# Patient Record
Sex: Male | Born: 1940
Health system: Southern US, Community
[De-identification: ages and names within clinical notes are randomized; demographics above are authoritative.]

## PROBLEM LIST (undated history)

## (undated) DIAGNOSIS — H353110 Nonexudative age-related macular degeneration, right eye, stage unspecified: Secondary | ICD-10-CM

## (undated) DIAGNOSIS — E7800 Pure hypercholesterolemia, unspecified: Secondary | ICD-10-CM

## (undated) DIAGNOSIS — I10 Essential (primary) hypertension: Secondary | ICD-10-CM

## (undated) DIAGNOSIS — I4892 Unspecified atrial flutter: Secondary | ICD-10-CM

## (undated) DIAGNOSIS — M545 Low back pain, unspecified: Secondary | ICD-10-CM

## (undated) DIAGNOSIS — J301 Allergic rhinitis due to pollen: Secondary | ICD-10-CM

## (undated) DIAGNOSIS — E0590 Thyrotoxicosis, unspecified without thyrotoxic crisis or storm: Secondary | ICD-10-CM

## (undated) DIAGNOSIS — J189 Pneumonia, unspecified organism: Secondary | ICD-10-CM

## (undated) DIAGNOSIS — G8929 Other chronic pain: Secondary | ICD-10-CM

## (undated) DIAGNOSIS — I480 Paroxysmal atrial fibrillation: Secondary | ICD-10-CM

## (undated) DIAGNOSIS — I2510 Atherosclerotic heart disease of native coronary artery without angina pectoris: Secondary | ICD-10-CM

## (undated) HISTORY — PX: ATRIAL ABLATION SURGERY: SHX560

## (undated) HISTORY — DX: Unspecified atrial flutter: I48.92

## (undated) HISTORY — PX: COLONOSCOPY: SHX174

## (undated) HISTORY — DX: Thyrotoxicosis, unspecified without thyrotoxic crisis or storm: E05.90

## (undated) HISTORY — DX: Paroxysmal atrial fibrillation: I48.0

## (undated) HISTORY — PX: COLONOSCOPY W/ BIOPSIES AND POLYPECTOMY: SHX1376

## (undated) HISTORY — DX: Essential (primary) hypertension: I10

---

## 1990-02-11 DIAGNOSIS — J189 Pneumonia, unspecified organism: Secondary | ICD-10-CM

## 1990-02-11 HISTORY — DX: Pneumonia, unspecified organism: J18.9

## 1999-02-12 HISTORY — PX: CARDIOVERSION: SHX1299

## 1999-11-29 ENCOUNTER — Encounter: Payer: Self-pay | Admitting: Internal Medicine

## 1999-12-04 ENCOUNTER — Inpatient Hospital Stay (HOSPITAL_COMMUNITY): Admission: EM | Admit: 1999-12-04 | Discharge: 1999-12-07 | Payer: Self-pay | Admitting: Emergency Medicine

## 2001-01-01 ENCOUNTER — Emergency Department (HOSPITAL_COMMUNITY): Admission: EM | Admit: 2001-01-01 | Discharge: 2001-01-02 | Payer: Self-pay | Admitting: *Deleted

## 2001-06-22 ENCOUNTER — Encounter: Payer: Self-pay | Admitting: Cardiology

## 2001-06-22 ENCOUNTER — Inpatient Hospital Stay (HOSPITAL_COMMUNITY): Admission: AD | Admit: 2001-06-22 | Discharge: 2001-06-24 | Payer: Self-pay | Admitting: Cardiology

## 2007-06-22 ENCOUNTER — Encounter: Payer: Self-pay | Admitting: Internal Medicine

## 2009-06-28 ENCOUNTER — Encounter: Payer: Self-pay | Admitting: Internal Medicine

## 2009-07-03 ENCOUNTER — Encounter: Payer: Self-pay | Admitting: Internal Medicine

## 2009-07-03 LAB — TSH: TSH: 1.79 u[IU]/mL (ref <=5.90)

## 2009-07-03 LAB — BASIC METABOLIC PANEL: Potassium: 5.5 mmol/L — AB (ref 3.4–5.3)

## 2009-07-04 ENCOUNTER — Encounter: Payer: Self-pay | Admitting: Internal Medicine

## 2009-08-02 ENCOUNTER — Ambulatory Visit: Payer: Self-pay | Admitting: Internal Medicine

## 2009-08-02 DIAGNOSIS — I4892 Unspecified atrial flutter: Secondary | ICD-10-CM

## 2009-08-02 DIAGNOSIS — I4891 Unspecified atrial fibrillation: Secondary | ICD-10-CM

## 2009-08-09 ENCOUNTER — Encounter: Payer: Self-pay | Admitting: Internal Medicine

## 2009-08-17 ENCOUNTER — Encounter: Payer: Self-pay | Admitting: Internal Medicine

## 2009-08-22 ENCOUNTER — Encounter: Payer: Self-pay | Admitting: Cardiology

## 2009-09-12 ENCOUNTER — Ambulatory Visit: Payer: Self-pay | Admitting: Internal Medicine

## 2009-09-13 LAB — CONVERTED CEMR LAB
BUN: 13 mg/dL (ref 6–23)
Basophils Absolute: 0 10*3/uL (ref 0.0–0.1)
Basophils Relative: 0.4 % (ref 0.0–3.0)
CO2: 29 meq/L (ref 19–32)
Calcium: 8.9 mg/dL (ref 8.4–10.5)
Chloride: 111 meq/L (ref 96–112)
Creatinine, Ser: 0.9 mg/dL (ref 0.4–1.5)
Eosinophils Absolute: 0.1 10*3/uL (ref 0.0–0.7)
Eosinophils Relative: 1.5 % (ref 0.0–5.0)
GFR calc non Af Amer: 88.97 mL/min (ref >60)
Glucose, Bld: 88 mg/dL (ref 70–99)
HCT: 39.3 % (ref 39.0–52.0)
Hemoglobin: 13.5 g/dL (ref 13.0–17.0)
INR: 2.9 — ABNORMAL HIGH (ref 0.8–1.0)
Lymphocytes Relative: 20.2 % (ref 12.0–46.0)
Lymphs Abs: 1.5 10*3/uL (ref 0.7–4.0)
MCHC: 34.4 g/dL (ref 30.0–36.0)
MCV: 94.3 fL (ref 78.0–100.0)
Monocytes Absolute: 0.7 10*3/uL (ref 0.1–1.0)
Monocytes Relative: 9.7 % (ref 3.0–12.0)
Neutro Abs: 4.9 10*3/uL (ref 1.4–7.7)
Neutrophils Relative %: 68.2 % (ref 43.0–77.0)
Platelets: 205 10*3/uL (ref 150.0–400.0)
Potassium: 4.3 meq/L (ref 3.5–5.1)
Prothrombin Time: 31.1 s — ABNORMAL HIGH (ref 9.7–11.8)
RBC: 4.17 M/uL — ABNORMAL LOW (ref 4.22–5.81)
RDW: 15.6 % — ABNORMAL HIGH (ref 11.5–14.6)
Sodium: 142 meq/L (ref 135–145)
WBC: 7.2 10*3/uL (ref 4.5–10.5)
aPTT: 43.7 s — ABNORMAL HIGH (ref 21.7–28.8)

## 2009-09-18 ENCOUNTER — Ambulatory Visit: Payer: Self-pay | Admitting: Cardiovascular Disease

## 2009-09-21 ENCOUNTER — Telehealth: Payer: Self-pay | Admitting: Internal Medicine

## 2009-10-10 ENCOUNTER — Ambulatory Visit: Payer: Self-pay | Admitting: Internal Medicine

## 2009-10-10 ENCOUNTER — Telehealth: Payer: Self-pay | Admitting: Internal Medicine

## 2009-10-10 LAB — CONVERTED CEMR LAB
BUN: 17 mg/dL (ref 6–23)
Basophils Absolute: 0 10*3/uL (ref 0.0–0.1)
Basophils Relative: 0.4 % (ref 0.0–3.0)
CO2: 27 meq/L (ref 19–32)
Calcium: 8.8 mg/dL (ref 8.4–10.5)
Chloride: 102 meq/L (ref 96–112)
Creatinine, Ser: 0.9 mg/dL (ref 0.4–1.5)
Eosinophils Absolute: 0.2 10*3/uL (ref 0.0–0.7)
Eosinophils Relative: 2 % (ref 0.0–5.0)
GFR calc non Af Amer: 90.11 mL/min (ref >60)
Glucose, Bld: 95 mg/dL (ref 70–99)
HCT: 37.9 % — ABNORMAL LOW (ref 39.0–52.0)
Hemoglobin: 12.9 g/dL — ABNORMAL LOW (ref 13.0–17.0)
INR: 2.6 — ABNORMAL HIGH (ref 0.8–1.0)
Lymphocytes Relative: 22.3 % (ref 12.0–46.0)
Lymphs Abs: 1.7 10*3/uL (ref 0.7–4.0)
MCHC: 34.1 g/dL (ref 30.0–36.0)
MCV: 94.7 fL (ref 78.0–100.0)
Monocytes Absolute: 0.7 10*3/uL (ref 0.1–1.0)
Monocytes Relative: 9.8 % (ref 3.0–12.0)
Neutro Abs: 4.9 10*3/uL (ref 1.4–7.7)
Neutrophils Relative %: 65.5 % (ref 43.0–77.0)
Platelets: 200 10*3/uL (ref 150.0–400.0)
Potassium: 4.4 meq/L (ref 3.5–5.1)
Prothrombin Time: 28.1 s — ABNORMAL HIGH (ref 9.7–11.8)
RBC: 4 M/uL — ABNORMAL LOW (ref 4.22–5.81)
RDW: 15.3 % — ABNORMAL HIGH (ref 11.5–14.6)
Sodium: 136 meq/L (ref 135–145)
WBC: 7.6 10*3/uL (ref 4.5–10.5)
aPTT: 41.1 s — ABNORMAL HIGH (ref 21.7–28.8)

## 2009-10-12 ENCOUNTER — Encounter: Payer: Self-pay | Admitting: Internal Medicine

## 2009-10-12 HISTORY — PX: TRANSESOPHAGEAL ECHOCARDIOGRAM: SHX273

## 2009-10-19 ENCOUNTER — Encounter: Payer: Self-pay | Admitting: Cardiology

## 2009-10-19 ENCOUNTER — Ambulatory Visit: Payer: Self-pay | Admitting: Internal Medicine

## 2009-10-19 ENCOUNTER — Observation Stay (HOSPITAL_COMMUNITY): Admission: RE | Admit: 2009-10-19 | Discharge: 2009-10-20 | Payer: Self-pay | Admitting: Internal Medicine

## 2010-01-17 ENCOUNTER — Ambulatory Visit: Payer: Self-pay | Admitting: Internal Medicine

## 2010-01-17 ENCOUNTER — Encounter: Payer: Self-pay | Admitting: Internal Medicine

## 2010-02-22 ENCOUNTER — Encounter: Payer: Self-pay | Admitting: Cardiology

## 2010-02-23 ENCOUNTER — Encounter: Payer: Self-pay | Admitting: Cardiology

## 2010-02-23 DIAGNOSIS — Z7901 Long term (current) use of anticoagulants: Secondary | ICD-10-CM | POA: Insufficient documentation

## 2010-02-23 DIAGNOSIS — E0590 Thyrotoxicosis, unspecified without thyrotoxic crisis or storm: Secondary | ICD-10-CM | POA: Insufficient documentation

## 2010-02-23 DIAGNOSIS — I480 Paroxysmal atrial fibrillation: Secondary | ICD-10-CM | POA: Insufficient documentation

## 2010-02-23 DIAGNOSIS — H3530 Unspecified macular degeneration: Secondary | ICD-10-CM | POA: Insufficient documentation

## 2010-02-23 DIAGNOSIS — Z5181 Encounter for therapeutic drug level monitoring: Secondary | ICD-10-CM | POA: Insufficient documentation

## 2010-03-13 NOTE — Progress Notes (Signed)
Summary: Med List  Med List   Imported By: Roderic Ovens 09/26/2009 14:53:44  _____________________________________________________________________  External Attachment:    Type:   Image     Comment:   External Document

## 2010-03-13 NOTE — Assessment & Plan Note (Signed)
Summary: John Sheppard   Visit Type:  Initial Consult Referring Provider:  Peter Swaziland, MD Primary Provider:  Jarold Motto  CC:  afib.  History of Present Illness: John Sheppard is a pleasant 70 yo WM with a h/o paroxysmal atrial fibrillation and atrial flutter who presents today for EP consultation regarding his atrial arrhythmias.  He reports initially being diagnosed with afib in 2001 and being found to have RVR while donating blood.  He was evaluated by Dr Swaziland and initiated on Toprol for rate control.  He was found to have a tachycardia mediated cardiomyopathy with an EF of 30%.  He was iniated on coumadin and then cardioverted to sinus rhythm.  He reports maintaining sinus rhythm for a year before returning to afib.  He reports increasing frequency and duration of atrial fibrillation since that time.  He reports having episodes during his sleep and also in the morning.  He was placed on amiodarone for a short period of time but did not tolerate this medicine well.  He was then placed on sotalol.  He does not feel that his afib has improved with sotalol.  Presently, he reports having episodes of afib every 1-2 weeks.  He reports that episodes typically last several hours.  He reports symptoms of palpitations.  He also reports post termination pauses with dizziness lasting only several seconds.  He reports having brief LOC several weeks ago during conversion to sinus rhythm.  Current Medications (verified): 1)  Betapace 160 Mg Tabs (Sotalol Hcl) .Marland Kitchen.. 1 By Mouth Two Times A Day 2)  Warfarin Sodium 4 Mg Tabs (Warfarin Sodium) .... As Directed  Allergies (verified): No Known Drug Allergies  Past History:  Past Medical History: Paroxysmal atrial fibrillation Typical appearing atrial flutter Prior tachycardia mediated cardiomypathy with EF 30% now resolved Macular degeneration hyperthyroidism with amiodarone s/p therapy with PTU  Past Surgical History: none  Family  History: father died at age 68 of CHF  Social History: Lives in [place] with spouse.  Retired from [employer].  Denies tobacco,  occasional ETOH.  Review of Systems       All systems are reviewed and negative except as listed in the HPI.   Vital Signs:  Patient profile:   70 year old male Height:      72 inches Weight:      184 pounds BMI:     25.05 Pulse rate:   57 / minute Resp:     18 per minute BP sitting:   122 / 81  (right arm)  Vitals Entered By: Marrion Coy, CNA (August 02, 2009 2:48 PM)  Physical Exam  General:  Well developed, well nourished, in no acute distress. Head:  normocephalic and atraumatic Eyes:  PERRLA/EOM intact; conjunctiva and lids normal. Nose:  no deformity, discharge, inflammation, or lesions Mouth:  Teeth, gums and palate normal. Oral mucosa normal. Neck:  Neck supple, no JVD. No masses, thyromegaly or abnormal cervical nodes. Lungs:  Clear bilaterally to auscultation and percussion. Heart:  Non-displaced PMI, chest non-tender; regular rate and rhythm, S1, S2 without murmurs, rubs or gallops. Carotid upstroke normal, no bruit. Normal abdominal aortic size, no bruits. Femorals normal pulses, no bruits. Pedals normal pulses. No edema, no varicosities. Abdomen:  Bowel sounds positive; abdomen soft and non-tender without masses, organomegaly, or hernias noted. No hepatosplenomegaly. Msk:  Back normal, normal gait. Muscle strength and tone normal. Pulses:  pulses normal in all 4 extremities Extremities:  No clubbing or cyanosis. Neurologic:  Alert and oriented x  3. Skin:  Intact without lesions or rashes. Psych:  Normal affect.   EKG  Procedure date:  08/02/2009  Findings:      sinus bradycardia 57 bpm, PR 162, QTc 400, otherwise normal ekg  Impression & Recommendations:  Problem # 1:  ATRIAL FIBRILLATION (ICD-427.31) The patient has symptomatic paroxysmal atrial fibrillation and typical appearing atrial flutter.  He has failed medical therapy  with toprol, amiodarone, and sotalol.  He is chronically anticoagulated with coumadin.  Therapeutic strategies for afib and atrial flutter including medicine and Sheppard were discussed in detail with the patient today. Risk, benefits, and alternatives to EP study and radiofrequency Sheppard  were also discussed in detail today. These risks include but are not limited to stroke, bleeding, vascular damage, tamponade, perforation, damage to the esophagus, lungs, and other structures, pulmonary vein stenosis, worsening renal function, and death. The patient understands these risk and wishes to proceed. We will schedule Sheppard at the next available time.  Appended Document: John Sheppard Echo reviewed from 3/11 (Dr Swaziland)  reveals normal LV size and function.  Normal LA size, without significant valvular abnormality.

## 2010-03-13 NOTE — Letter (Signed)
Summary: ELectrophysiology/Ablation Procedure Instructions  [facility], Main Office  [address]   [place], [place] [address]   Phone: [phone]  Fax: [phone]     Electrophysiology/Ablation Procedure Instructions    You are scheduled for a(n) a-fib ablation on 09/05/2009 at 7:30am with Dr. Johney Frame.  1.  Please come to the Short Stay Center at [facility] at 5:30am on the day of your procedure.  2.  Come prepared to stay overnight.   Please bring your insurance cards and a list of your medications.  3.  Come to the [place] office on 08/29/09 at 10:30am for lab work. .  You do not have to be fasting.  4.  Do not have anything to eat or drink after midnight the night before your procedure.  5.   All of your remaining medications may be taken with a small amount of water.  6.  Educational material received:    _____ Ablation   * Occasionally, EP studies and ablations can become lengthy.  Please make your family aware of this before your procedure starts.  Average time ranges from 2-8 hours for EP studies/ablations.  Your physician will locate your family after the procedure with the results.  * If you have any questions after you get home, please call the office at [phone].  Anselm Pancoast  TEE--with Dr Swaziland they will call with time to be at the hospital on 09/04/09  Appended Document: ELectrophysiology/Ablation Procedure Instructions will change pt's procedure date to 09/19/2009  TEE--09/18/09 with Dr Swaziland  Melinda aware and will arange  Labs will be done on 09/12/09 at 10:15.

## 2010-03-13 NOTE — Letter (Signed)
Summary: [facility] Office Visit Note   [facility] Office Visit Note   Imported By: Roderic Ovens 09/26/2009 15:17:02  _____________________________________________________________________  External Attachment:    Type:   Image     Comment:   External Document

## 2010-03-13 NOTE — Assessment & Plan Note (Signed)
Summary: John Sheppard   Visit Type:  EPH Referring John Sheppard:  Peter Swaziland, MD Primary John Sheppard:  John Sheppard   History of Present Illness: The patient presents today for routine electrophysiology followup. He reports doing very well since his afib ablation.  He denies complications related to the procedure.  He has had occaisonal ERAF but feels that his afib has significantly improved since his ablation.   The patient denies symptoms of chest pain, shortness of breath, orthopnea, PND, lower extremity edema, presyncope, syncope, or neurologic sequela.  He has had one episode of dizziness upon conversion of afib to sinus but denies presyncope or syncope. The patient is tolerating medications without difficulties and is otherwise without complaint today.   Current Medications (verified): 1)  Betapace 160 Mg Tabs (Sotalol Hcl) .Marland Kitchen.. 1 By Mouth Two Times A Day 2)  Warfarin Sodium 4 Mg Tabs (Warfarin Sodium) .... As Directed  Allergies (verified): No Known Drug Allergies  Past History:  Past Medical History: Paroxysmal atrial fibrillation s/p PVI 9/11 Typical appearing atrial flutter Prior tachycardia mediated cardiomypathy with EF 30% now resolved Macular degeneration hyperthyroidism with amiodarone s/p therapy with PTU  Past Surgical History: s/p PVI 9/11  Social History: Reviewed history from 08/02/2009 and no changes required. Lives in [place] with spouse.  Retired from [employer].  Denies tobacco,  occasional ETOH.  Vital Signs:  Patient profile:   70 year old male Height:      72 inches Weight:      185 pounds BMI:     25.18 Pulse rate:   57 / minute Pulse rhythm:   irregular BP sitting:   134 / 82  (right arm) Cuff size:   large  Vitals Entered By: Danielle Rankin, CMA (January 17, 2010 10:25 AM)  Physical Exam  General:  Well developed, well nourished, in no acute distress. Head:  normocephalic and atraumatic Eyes:  PERRLA/EOM intact; conjunctiva and lids normal. Mouth:   Teeth, gums and palate normal. Oral mucosa normal. Neck:  Neck supple, no JVD. No masses, thyromegaly or abnormal cervical nodes. Lungs:  Clear bilaterally to auscultation and percussion. Heart:  Non-displaced PMI, chest non-tender; regular rate and rhythm, S1, S2 without murmurs, rubs or gallops. Carotid upstroke normal, no bruit. Normal abdominal aortic size, no bruits. Femorals normal pulses, no bruits. Pedals normal pulses. No edema, no varicosities. Abdomen:  Bowel sounds positive; abdomen soft and non-tender without masses, organomegaly, or hernias noted. No hepatosplenomegaly. Msk:  Back normal, normal gait. Muscle strength and tone normal. Extremities:  No clubbing or cyanosis. Neurologic:  Alert and oriented x 3. Skin:  Intact without lesions or rashes. Psych:  Normal affect.   EKG  Procedure date:  01/17/2010  Findings:      sinus bradycardia 57 bpm, PR 164, Qtc 412, otherwise normal ekg  Impression & Recommendations:  Problem # 1:  ATRIAL FIBRILLATION (ICD-427.31) doing well s/p ablation with occasional ERAF.  We will continue to monitor for return of sypmtomatic afib. Continue current medicine return in 3 months  Patient Instructions: 1)  Your physician recommends that you schedule a follow-up appointment in: 3 months. 2)  Your physician recommends that you continue on your current medications as directed. Please refer to the Current Medication list given to you today.

## 2010-03-13 NOTE — Letter (Signed)
Summary: [facility] EKG   [facility] EKG   Imported By: Roderic Ovens 09/26/2009 15:22:45  _____________________________________________________________________  External Attachment:    Type:   Image     Comment:   External Document

## 2010-03-13 NOTE — Progress Notes (Signed)
Summary: ablation -scheduled Tresa Endo to call 8/15)  Phone Note Call from Patient Call back at Home Phone [phone]   Caller: Spouse- carolyn  Reason for Call: Talk to Nurse Summary of Call: checking the status of ablation to be scheduled Initial call taken by: Lorne Skeens,  September 21, 2009 11:29 AM  Follow-up for Phone Call        I called and spoke with Mrs. Eppinger. I made her aware that I will have Tresa Endo, Dr. Jenel Lucks nurse, call them back on Monday 09/25/09. She is agreeable.  per pt calling back [phone] Lorne Skeens  September 25, 2009 9:00 AM  Follow-up by: Sherri Rad, RN, BSN,  September 21, 2009 11:48 AM  Additional Follow-up for Phone Call Additional follow up Details #1::        ablation 10/17/09 labs 10/10/09 at 10:30am Dennis Bast, RN, BSN  September 25, 2009 6:27 PM

## 2010-03-13 NOTE — Progress Notes (Signed)
Summary: discuss procedure on 9/6  Phone Note Call from Patient Call back at Home Phone [phone]   Caller: Patient Reason for Call: Talk to Nurse Summary of Call: Need to discuss Tee/Cardioversion on 9/6.  Want to make sure everythings is in order since this is a resched. procedure.  Initial call taken by: Omar Person,  October 10, 2009 1:35 PM  Follow-up for Phone Call        left mess for pt would have Tresa Endo call her tom 8/31, looks like pt needs a-fib ablation not a dccv, but nothing is sch Meredith Staggers, RN  October 10, 2009 1:52 PM  spoke with wife will do ablation on 10/19/09 at 7:30am  will call tomorrow and let me know when he can come in for labs Dennis Bast, RN, BSN  October 11, 2009 6:19 PM pt got labs on 10/10/09 will not need labs  Spoke with Aurelio Jew  he will arraange TEE in EP lab sameday as ablation Dennis Bast, RN, BSN  October 12, 2009 9:35 AM

## 2010-03-15 ENCOUNTER — Ambulatory Visit (INDEPENDENT_AMBULATORY_CARE_PROVIDER_SITE_OTHER): Payer: Medicare HMO | Admitting: Cardiology

## 2010-03-15 ENCOUNTER — Ambulatory Visit: Payer: Self-pay | Admitting: Cardiology

## 2010-03-15 DIAGNOSIS — I4891 Unspecified atrial fibrillation: Secondary | ICD-10-CM

## 2010-03-15 DIAGNOSIS — Z7901 Long term (current) use of anticoagulants: Secondary | ICD-10-CM

## 2010-04-26 ENCOUNTER — Encounter: Payer: Self-pay | Admitting: Internal Medicine

## 2010-04-26 ENCOUNTER — Ambulatory Visit (INDEPENDENT_AMBULATORY_CARE_PROVIDER_SITE_OTHER): Payer: Medicare HMO | Admitting: Internal Medicine

## 2010-04-26 DIAGNOSIS — I4891 Unspecified atrial fibrillation: Secondary | ICD-10-CM

## 2010-04-26 LAB — BASIC METABOLIC PANEL
BUN: 12 mg/dL (ref 6–23)
CO2: 27 mEq/L (ref 19–32)
Chloride: 106 mEq/L (ref 96–112)
Creatinine, Ser: 0.87 mg/dL (ref 0.4–1.5)
Glucose, Bld: 104 mg/dL — ABNORMAL HIGH (ref 70–99)
Potassium: 4.1 mEq/L (ref 3.5–5.1)

## 2010-04-26 LAB — CBC
HCT: 41.7 % (ref 39.0–52.0)
MCH: 31.3 pg (ref 26.0–34.0)
MCV: 92.5 fL (ref 78.0–100.0)
Platelets: 205 10*3/uL (ref 150–400)
RDW: 13.9 % (ref 11.5–15.5)
WBC: 7.3 10*3/uL (ref 4.0–10.5)

## 2010-04-26 LAB — MRSA PCR SCREENING: MRSA by PCR: NEGATIVE

## 2010-05-01 NOTE — Assessment & Plan Note (Signed)
Summary: per ck out on 12/7/lg/kl   Visit Type:  Follow-up Referring Provider:  Peter Swaziland, MD Primary Provider:  Jarold Motto   History of Present Illness: The patient presents today for routine electrophysiology followup. He reports doing very well since his afib ablation.  His afib burden has dramatically improved.  He does recall having two episodes since last being seen in our office where he developed sustained palpitations and suspects that he was in afib.  These episodes lasted several hours, waking him from sleep.  Prior to ablation, he reports having daily afib and feels that this has resolved.   The patient denies symptoms of chest pain, shortness of breath, orthopnea, PND, lower extremity edema, presyncope, syncope, or neurologic sequela.  He has had prior dizziness upon conversion of afib to sinus but denies presyncope or syncope. The patient is tolerating medications without difficulties and is otherwise without complaint today.   Current Medications (verified): 1)  Betapace 160 Mg Tabs (Sotalol Hcl) .Marland Kitchen.. 1 By Mouth Two Times A Day 2)  Warfarin Sodium 4 Mg Tabs (Warfarin Sodium) .... As Directed  Allergies (verified): No Known Drug Allergies  Past History:  Past Medical History: Reviewed history from 01/17/2010 and no changes required. Paroxysmal atrial fibrillation s/p PVI 9/11 Typical appearing atrial flutter Prior tachycardia mediated cardiomypathy with EF 30% now resolved Macular degeneration hyperthyroidism with amiodarone s/p therapy with PTU  Past Surgical History: Reviewed history from 01/17/2010 and no changes required. s/p PVI 9/11  Social History: Reviewed history from 08/02/2009 and no changes required. Lives in [place] with spouse.  Retired from [employer].  Denies tobacco,  occasional ETOH.  Review of Systems       All systems are reviewed and negative except as listed in the HPI.   Vital Signs:  Patient profile:   70 year old male Height:       72 inches Weight:      187 pounds BMI:     25.45 Pulse rate:   53 / minute BP sitting:   120 / 70  (left arm)  Vitals Entered By: Laurance Flatten CMA (April 26, 2010 11:13 AM)  Physical Exam  General:  Well developed, well nourished, in no acute distress. Head:  normocephalic and atraumatic Eyes:  PERRLA/EOM intact; conjunctiva and lids normal. Mouth:  Teeth, gums and palate normal. Oral mucosa normal. Neck:  Neck supple, no JVD. No masses, thyromegaly or abnormal cervical nodes. Lungs:  Clear bilaterally to auscultation and percussion. Heart:  Non-displaced PMI, chest non-tender; regular rate and rhythm, S1, S2 without murmurs, rubs or gallops. Carotid upstroke normal, no bruit. Normal abdominal aortic size, no bruits. Femorals normal pulses, no bruits. Pedals normal pulses. No edema, no varicosities. Abdomen:  Bowel sounds positive; abdomen soft and non-tender without masses, organomegaly, or hernias noted. No hepatosplenomegaly. Msk:  Back normal, normal gait. Muscle strength and tone normal. Extremities:  No clubbing or cyanosis. Neurologic:  Alert and oriented x 3. Skin:  Intact without lesions or rashes. Psych:  Normal affect.   EKG  Procedure date:  04/26/2010  Findings:      sinus bradycardia 53 bpm, Qtc 418, othrewise normal ekg  Impression & Recommendations:  Problem # 1:  ATRIAL FIBRILLATION (ICD-427.31) doing very well s/p ablation he may be still having rare afib but feels overall much improved.  He does not feel that his symtoms warrant repeat ablation.  We will continue sotalol, coumadin, and follow.  Other Orders: EKG w/ Interpretation (93000)  Patient Instructions: 1)  Your physician recommends that you continue on your current medications as directed. Please refer to the Current Medication list given to you today. 2)  Your physician wants you to follow-up in:6 months with Dr. Johney Frame.   You will receive a reminder letter in the mail two months in advance. If  you don't receive a letter, please call our office to schedule the follow-up appointment.

## 2010-06-19 ENCOUNTER — Encounter: Payer: Self-pay | Admitting: Cardiology

## 2010-06-29 NOTE — Discharge Summary (Signed)
[place]. [facility]  Patient:    ABDIRAHIM, FLAVELL                      MRN: [id number] Adm. Date:  [id number] Disc. Date: [id number] Attending:  Swaziland, Peter Manning CC:         Ivery Quale, M.D.   Discharge Summary  HISTORY OF PRESENT ILLNESS:  Mr. Siddoway is a 70 year old white male generally in excellent health, who presented with a new onset of atrial flutter with a rapid ventricular response.  Attempts at controlling his rate as an outpatient were relatively unsuccessful.  Echocardiogram showed diminished LV function which was felt to be consistent with rapid rate.  He was admitted for further management with IV medications and attempt at cardioversion.  PAST MEDICAL HISTORY:  The patients prior medical history is unremarkable. For details of his past medical history, social history, family history, and physical examination, please see admission history and physical.  LABORATORY DATA:  The white count was 12,100, hemoglobin 14.9, hematocrit 43, platelets 255,000, repeat white count was 9100, pro time was 16.6 with an INR of 1.6.  The chemistry panel was normal.  The lipid panel showed a cholesterol of 186, triglycerides 137, HDL 46, LDL of 113.  Initial TSH was mildly low at 0.034.  A repeat TSH was less than 0.019, free T4 was 1.32, T3 was pending at the time of discharge.  The initial EKG showed atrial flutter with a rapid ventricular response.  HOSPITAL COURSE:  The patient was admitted.  He was anticoagulated with Lovenox until his Coumadin was therapeutic.  His discharge pro time was 24.2 with an INR of 3.2.  His rate was controlled with IV Cardizem and he was continued on oral beta blocker.  He was also given IV load of amiodarone and then switched to a p.o. dose.  On December 06, 1999, the patient underwent a TEE cardioversion.  His TEE showed no thrombus.  There was mild left atrial enlargement, mild mitral and tricuspid insufficiency,  left ventricular function had improved compared to his prior echo study, with ejection fraction approximately 50%.  He underwent successful cardioversion and maintained sinus rhythm throughout the remainder of his hospital stay.  He was discharged home in stable condition on December 07, 1999.  DISCHARGE DIAGNOSES: 1. Atrial flutter of new onset. 2. Left ventricular dysfunction, secondary to tachycardia. 3. Probably hyperthyroidism.  DISCHARGE MEDICATIONS: 1. Coumadin 2.5 mg on Friday and Saturday, 5 mg on Sunday.  The patient will    have a pro time done on Monday. 2. Amiodarone 400 mg daily. 3. Toprol XL 50 mg twice a day.  DISCHARGE INSTRUCTIONS:  The patient may progressively increase his walking. He is asked to avoid caffeine.  He will have follow-up office visit with Dr. Swaziland in two weeks.  He will see Dr. Jarold Motto next week to address the thyroid abnormality.  DISCHARGE STATUS:  Improved. DD:  12/07/99 TD:  12/07/99 Job: [id number]

## 2010-06-29 NOTE — H&P (Signed)
[place]  Patient:    John Sheppard, John Sheppard Visit Number: [id number] MRN: [id number]          Service Type: MED Location: [phone] Attending Physician:  Swazil[place], Peter Manning Dictated by:   Peter M. Swaziland, M.D. Admit Date:  06/22/2001 Discharge Date: 06/24/2001   CC:         Barry Dienes. Eloise Harman, M.D.   History and Physical  DATE OF BIRTH: [birth year]  HISTORY OF PRESENT ILLNESS: [name] is a 70 year old, with a history of paroxysmal atrial fibrillation.  Previously this was well controlled on Toprol-XL.  He also has a history of hyperthyroidism.  He was initially diagnosed in October 2001 and underwent cardioversion at that time.  He was diagnosed with hyperthyroidism that was treated with PTU.  He was subsequently placed on a beta-blocker and had good control of his arrhythmia.  On his initial diagnosis he had a tachycardia-induced cardiomyopathy with ejection fraction of 30-35%.  Subsequently he has had increased episodes of tachy palpitations over the past four months.  These episodes have been associated with sweating, tachy palpitations, and have been more sustained, lasting up to one to two hours.  They have also been increasing in frequency.  An event monitor documented these episodes to be atrial fibrillation.  Because of his increased symptomatology he is now admitted for initiation of antiarrhythmic drug therapy.  Of note, his most recent echocardiogram on April 21, 2001 showed normal left ventricular function with ejection fraction 60-65% and mild mitral insufficiency.  PAST MEDICAL HISTORY:  1. Hyperthyroidism, status post treatment with PTU.  2. History of tachycardia mediated cardiomyopathy, resolved.  3. Atrial fibrillation.  ALLERGIES: None known.  MEDICATIONS:  1. Toprol-XL 100 mg q.d.  2. Aspirin q.d.  3. Multivitamin q.d.  SOCIAL HISTORY: The patient is retired.  He is married.  He has two children. He  denies smoking or alcohol use.  FAMILY HISTORY: Father died at age 81 with congestive heart failure.  Mother died at age 56 of old age.  REVIEW OF SYSTEMS: Otherwise unremarkable.  No recent weight loss.  Appetite has been normal.  PHYSICAL EXAMINATION:  GENERAL: The patient is a well-developed white male in no apparent distress.  VITAL SIGNS: Blood pressure 116/80, pulse 60 and regular, respirations 20.  HEENT: Normocephalic, atraumatic.  PERRLA.  EOMI.  LUNGS: Clear.  CARDIAC: Regular rate and rhythm without murmurs, rubs, gallops, or clicks.  ABDOMEN: Soft, nontender.  Bowel sounds are positive.  He has no hepatosplenomegaly, masses, or bruits.  EXTREMITIES: Femoral and pedal pulses are 2+ and symmetric.  He has no edema.  NEUROLOGIC: Nonfocal.  The patient is alert and oriented x4.  IMPRESSION:  1. Recurrent atrial fibrillation, paroxysmal, but with increased frequency     and severity of symptoms.  2. History of tachycardia-induced cardiomyopathy, resolved.  3. Hyperthyroidism.  PLAN:  1. The patient will be admitted to telemetry.  2. Will check routine blood work.  3. Will initiate antiarrhythmic therapy with Betapace.  4. Will hold his beta-blocker.  5. Patient will also be anticoagulated with Coumadin. Dictated by:   Peter M. Swaziland, M.D. Attending Physician:  [place], Peter Manning DD:  06/19/01 TD:  06/22/01 Job: [id number]

## 2010-06-29 NOTE — Discharge Summary (Signed)
[place]. [facility]  Patient:    John Sheppard, John Sheppard Visit Number: [id number] MRN: [id number]          Service Type: MED Location: [phone] Attending Physician:  [place], Peter Manning Dictated by:   Peter M. [place], M.D. Admit Date:  06/22/2001 Discharge Date: 06/24/2001   CC:         Barry Dienes. Eloise Harman, M.D.   Discharge Summary  INDICATIONS FOR ADMISSION:  Patient is a 70 year old male with history of paroxysmal atrial fibrillation previously controlled on beta blocker therapy. Over the past four months the patient has been noticing increasing breakthrough arrhythmias with increasing severity and length of arrhythmia. For this reason the patient was admitted for initiation of antiarrhythmic drug therapy.    The patient did have an echocardiogram in March of 2003 which showed normal left ventricular function with mild mitral insufficiency.  He has otherwise been in excellent health.  For details of his past medical history, social history, family history, and physical examination, please see the admission history and physical.  LABORATORY DATA:  Chest x-ray showed no active disease.  His chest x-ray did show some calcified granulomata in the right and left lungs.  Electrocardiogram demonstrated normal sinus rhythm and sinus bradycardia, otherwise normal.  Free T4 was 1.09.  TSH 1.817.  CBC showed white blood cell count 7000, hemoglobin 12.9, hematocrit 38.2, platelet count 217K.  Coags were normal. CMET was normal.  Magnesium was 2.1.  HOSPITAL COURSE:  The patient was admitted to telemetry.  He was initiated on Betapace therapy, initially titrated to 120 mg twice a day. The patient tolerated this well without any symptoms.  He did, however, have one blocked sinus beat.  His pulse rate remained in the 50s and as we initiated his Betapace we also stopped his Toprol XL.  He was also started on Coumadin 5 mg per day.  With the blocked sinus beat we  reduced his Betapace to 80 mg twice a day.  The patient tolerated this load well. His QTC remained normal.  He was discharged home in stable condition on Jun 24, 2001.  DISCHARGE DIAGNOSES: 1. Paroxysmal atrial fibrillation. 2. Sinus bradycardia. 3. History of hyperthyroidism with now normal thyroid levels.  DISCHARGE MEDICATIONS: 1. Betapace 80 mg b.i.d. 2. Coumadin 5 mg per day.  ACTIVITY:  Patient may resume normal activities.  FOLLOW UP:  Patient will come in this Friday, Jun 26, 2001 for protime and will have a follow up office visit with Dr. [place]az[place]and the following Friday for protime and electrocardiogram. Dictated by:   Peter M. [place], M.D. Attending Physician:  [place], Peter Manning DD:  06/24/01 TD:  06/25/01 Job: [phone] [id number]

## 2010-06-29 NOTE — H&P (Signed)
[place]. [facility]  Patient:    John Sheppard, John Sheppard                      MRN: [id number] Adm. Date:  [id number] Attending:  [place], Peter Manning CC:         John Sheppard, M.D.   History and Physical  DATE OF BIRTH:  12-12-1940.  HISTORY OF PRESENT ILLNESS:  John Sheppard is a 70 year old white male who generally has been in excellent health.  He presented last week with new finding of atrial flutter with a rapid ventricular response.  He initially noted this when he was at a family funeral in [place] and he took his blood pressure at a drug store blood pressure monitor.  His blood pressure was normal but it was noted that his heart rate was 156.  He returned to [facility] and tried to donate blood last week, at which time again his pulse rate was 146.  He was referred to Dr. Barry Dienes. Eloise Sheppard who noted that he was in atrial flutter with a rapid ventricular response.  Initially, John Sheppard was completely asymptomatic.  He denied any shortness of breath, chest pain, dizziness, fatigue or palpitations.  He had been continuing on a regular exercise regimen, including walking and playing golf.  Because of his lack of symptoms, we initially tried outpatient therapy with Coumadin and rate control with Lopressor; however, patient returned to our office today and continued to have a rapid ventricular response at 150.  An echocardiogram showed poor left ventricular function with global hypokinesis and ejection fraction of 30-35%. Because of these findings, I recommended that he be hospitalized for more acute rate control and anticoagulation.  PAST MEDICAL HISTORY:  Past medical history is completely negative.  He has a remote history of pneumonia.  PAST SURGICAL HISTORY:  No prior surgeries.  ALLERGIES:  No known allergies.  CURRENT MEDICATIONS 1. Multivitamins daily. 2. Toprol XL 50 mg daily. 3. Coumadin 5 mg daily.  SOCIAL HISTORY:  Patient is  retired.  He is married and has two children in good health.  He denies tobacco use and rarely drinks alcohol.  He walks five days a week.  FAMILY HISTORY:  Father died at age 79 with congestive heart failure.  Mother died at age 25 of old age.  Two siblings have been in good health.  REVIEW OF SYSTEMS:  Review of systems is otherwise unremarkable.  PHYSICAL EXAMINATION  GENERAL:  Patient is a pleasant white male in no acute distress.  He is thin. Weight is 181 pounds.  VITAL SIGNS:  Blood pressure 118/88, pulse is 150 and regular.  HEENT:  Normocephalic, atraumatic.  Pupils are round and reactive. Conjunctivae are clear.  Oropharynx is clear.  NECK:  Supple without adenopathy, JVD or bruits.  LUNGS:  Clear to auscultation and percussion.  CARDIAC:  Tachycardic rhythm, without gallops or murmurs.  ABDOMEN:  Soft and nontender, without hepatosplenomegaly, masses or bruits.  EXTREMITIES:  Pedal pulses are 2+ and symmetric.  He has no edema or phlebitis.  NEUROLOGIC:  Exam is nonfocal.  SKIN:  Warm and dry.  LABORATORY DATA:  ECG shows atrial flutter with rapid ventricular response.  Recent CMET and CBC were normal.  IMPRESSION 1. Atrial flutter with a rapid ventricular response. 2. Left ventricular dysfunction, probably related to tachycardic-mediated    cardiomyopathy.  PLAN:  The patient will be admitted for A-V nodal blockade with IV Cardizem and continue on  beta blocker.  Will continue adjusting his Coumadin but start him on Lovenox until his Coumadin is therapeutic.  Once rate is controlled, will initiate antiarrhythmic therapy and consider for early cardioversion. DD:  12/04/99 TD:  12/05/99 Job: [id number]

## 2010-06-29 NOTE — Procedures (Signed)
[place]. [facility]  Patient:    John Sheppard, John Sheppard                      MRN: [id number] Proc. Date: 12/06/99 Adm. Date:  [id number] Attending:  [place], Peter Manning CC:         Barry Dienes. Eloise Harman, M.D.   Procedure Report  PROCEDURE: Transesophageal echocardiogram/elective cardioversion.  SURGEON: Peter M. [place], M.D.  INDICATIONS FOR PROCEDURE: The patient is a 70 year old white male with atrial flutter with rapid ventricular response of recent onset.  The patient had evidence of tachycardic mediated left ventricular dysfunction.  DESCRIPTION OF PROCEDURE: The patient was initially sedated with Versed 3 mg IV and Demerol 20 mg IV.  The transesophageal scope was passed easily down the esophagus and good quality images were obtained.  Findings were of a left ventricle normal in size.  The patients rate was increased during the procedure to 135 beats per minute.  With this his left ventricular function appeared mildly depressed with ejection fraction estimated at 45-50%.  The left atrium was mildly enlarged.  The right atrium was normal.  The right ventricle was normal in size and function.  All four valved appeared to have normal appearance and function.  By Doppler there is mild antral and tricuspid insufficiency.  There is no pericardial effusion.  There is no thrombus seen within the heart including the left atrial appendage, which is well seen.  The proximal aorta and descending aorta appear normal.  We then proceeded with elective cardioversion.  Anesthesia anesthetized the patient with pentothal 250 mg and the patient was given a single synchronized DC shock at 200 joules, with prompt return to normal sinus rhythm at a rate of 60 beats per minute.  FINAL INTERPRETATION:  1. Transesophageal echocardiogram demonstrates mild left ventricular     dysfunction.  Mild mitral and tricuspid insufficiency are noted.  Mild     left atrial enlargement is  noted.  No thrombus is seen.  2. Successful DC cardioversion. DD:  12/06/99 TD:  12/07/99 Job: [id number]

## 2010-08-10 ENCOUNTER — Other Ambulatory Visit: Payer: Self-pay | Admitting: Cardiology

## 2010-08-10 NOTE — Telephone Encounter (Signed)
escribe medication per fax request  

## 2010-08-12 HISTORY — PX: CATARACT EXTRACTION W/ INTRAOCULAR LENS  IMPLANT, BILATERAL: SHX1307

## 2010-09-07 ENCOUNTER — Encounter: Payer: Self-pay | Admitting: Internal Medicine

## 2010-09-13 ENCOUNTER — Encounter: Payer: Self-pay | Admitting: Internal Medicine

## 2010-09-13 ENCOUNTER — Ambulatory Visit (INDEPENDENT_AMBULATORY_CARE_PROVIDER_SITE_OTHER): Payer: Medicare HMO | Admitting: Internal Medicine

## 2010-09-13 DIAGNOSIS — R001 Bradycardia, unspecified: Secondary | ICD-10-CM | POA: Insufficient documentation

## 2010-09-13 DIAGNOSIS — I4891 Unspecified atrial fibrillation: Secondary | ICD-10-CM

## 2010-09-13 DIAGNOSIS — I498 Other specified cardiac arrhythmias: Secondary | ICD-10-CM

## 2010-09-13 NOTE — Assessment & Plan Note (Signed)
John Sheppard presents today for EP follow-up of afib.  He has developed recurrent symptomatic afib.  Therapeutic strategies for afib including medicine and ablation were discussed in detail with the patient today. Risk, benefits, and alternatives to EP study and radiofrequency ablation for afib were also discussed in detail today. These risks include but are not limited to stroke, bleeding, vascular damage, tamponade, perforation, damage to the esophagus, lungs, and other structures, pulmonary vein stenosis, worsening renal function, and death. The patient understands these risk and wishes to proceed.  We will therefore proceed with catheter ablation at the next available time.  We will obtain a cardiac CT prior to the procedure to assess the pulmonary veins for the procedure.

## 2010-09-13 NOTE — Progress Notes (Signed)
The patient presents today for routine electrophysiology followup.  Since last being seen in our clinic, the patient reports doing reasonably well.  He has developed recurrent symptomatic atrial fibrillation.  He feels that episodes occur in the evening and typically resolved prior to waking.  He has occasional symptomatic post termination pauses with mild dizziness.  He denies presyncope or syncope.  Today, he denies symptoms of chest pain, shortness of breath, orthopnea, PND, lower extremity edema, dizziness, presyncope, syncope, or neurologic sequela.  The patient feels that he is tolerating medications without difficulties and is otherwise without complaint today.   Past Medical History  Diagnosis Date  . Paroxysmal atrial fibrillation     s/p PVI 10/20/09  . Atrial flutter     s/p CTI ablation 10/20/09  . Hypertension   . Macular degeneration   . Hyperthyroidism    Past Surgical History  Procedure Date  . Cardioversion     2001  . Transesophageal echocardiogram     9/11  EF: 60-65%  . Atrial ablation surgery     PVI and CTI ablation by JA 10/21/10  . Cataract extraction     july, 2012    Current Outpatient Prescriptions  Medication Sig Dispense Refill  . Multiple Vitamin (MULTIVITAMIN) tablet Take 1 tablet by mouth daily.        . Multiple Vitamins-Minerals (OCUVITE PO) Take by mouth daily.        . sotalol (BETAPACE) 160 MG tablet TAKE 1 TABLET TWICE DAILY  180 tablet  3  . warfarin (COUMADIN) 7.5 MG tablet Take 7.5 mg by mouth as directed.          No Known Allergies  History   Social History  . Marital Status: Married    Spouse Name: N/A    Number of Children: N/A  . Years of Education: N/A   Occupational History  . Not on file.   Social History Main Topics  . Smoking status: Never Smoker   . Smokeless tobacco: Not on file  . Alcohol Use: 1.2 oz/week    2 Cans of beer per week     2 BEERS WKLY  . Drug Use: No  . Sexually Active: Not on file   Other Topics  Concern  . Not on file   Social History Narrative  . No narrative on file    Family History  Problem Relation Age of Onset  . Heart failure Father     DECEASED    ROS-  All systems are reviewed and are negative except as outlined in the HPI above    Physical Exam: Filed Vitals:   09/13/10 1030  BP: 138/87  Pulse: 57  Height: 6' (1.829 m)  Weight: 188 lb (85.276 kg)    GEN- The patient is well appearing, alert and oriented x 3 today.   Head- normocephalic, atraumatic Eyes-  Sclera clear, conjunctiva pink Ears- hearing intact Oropharynx- clear Neck- supple, no JVP Lymph- no cervical lymphadenopathy Lungs- Clear to ausculation bilaterally, normal work of breathing Heart- Regular rate and rhythm, no murmurs, rubs or gallops, PMI not laterally displaced GI- soft, NT, ND, + BS Extremities- no clubbing, cyanosis, or edema MS- no significant deformity or atrophy Skin- no rash or lesion Psych- euthymic mood, full affect Neuro- strength and sensation are intact  ekg today reveals sinus bradycardia 57 bpm, Qtc 420, otherwise normal ekg  Assessment and Plan:

## 2010-09-13 NOTE — Assessment & Plan Note (Signed)
He has symptomatic post termination pauses.  We had a long discussion about this today.  He is very clear in his decision to decline pacemaker.  Hopefully if we are able to maintain sinus rhythm, these pauses with no longer be an issue.  We will therefore proceed with afib ablation as above.  I have instructed him to not drive, get on ladders, operate machinery, etc when in afib due to concerns for post termination pauses.  He will alert me should he have syncope.

## 2010-09-20 ENCOUNTER — Other Ambulatory Visit: Payer: Self-pay | Admitting: Internal Medicine

## 2010-09-20 DIAGNOSIS — I4891 Unspecified atrial fibrillation: Secondary | ICD-10-CM

## 2010-09-27 ENCOUNTER — Encounter: Payer: Self-pay | Admitting: Internal Medicine

## 2010-10-01 ENCOUNTER — Telehealth: Payer: Self-pay | Admitting: Internal Medicine

## 2010-10-01 NOTE — Telephone Encounter (Signed)
lmom for pt's wife with dates and times

## 2010-10-01 NOTE — Telephone Encounter (Signed)
Pt's wife calling to see if his procedures have been set up yet

## 2010-10-05 ENCOUNTER — Encounter: Payer: Self-pay | Admitting: *Deleted

## 2010-10-08 ENCOUNTER — Other Ambulatory Visit (INDEPENDENT_AMBULATORY_CARE_PROVIDER_SITE_OTHER): Payer: Medicare HMO | Admitting: *Deleted

## 2010-10-08 ENCOUNTER — Other Ambulatory Visit: Payer: Self-pay | Admitting: *Deleted

## 2010-10-08 DIAGNOSIS — Z79899 Other long term (current) drug therapy: Secondary | ICD-10-CM

## 2010-10-08 DIAGNOSIS — I4891 Unspecified atrial fibrillation: Secondary | ICD-10-CM

## 2010-10-08 LAB — BASIC METABOLIC PANEL
Chloride: 106 mEq/L (ref 96–112)
Glucose, Bld: 96 mg/dL (ref 70–99)
Potassium: 4 mEq/L (ref 3.5–5.3)
Sodium: 139 mEq/L (ref 135–145)

## 2010-10-10 ENCOUNTER — Ambulatory Visit (HOSPITAL_COMMUNITY)
Admission: RE | Admit: 2010-10-10 | Discharge: 2010-10-10 | Disposition: A | Discharge status: Home / Self Care | Payer: Medicare HMO | Source: Ambulatory Visit | Attending: Internal Medicine | Admitting: Internal Medicine

## 2010-10-10 DIAGNOSIS — I4891 Unspecified atrial fibrillation: Secondary | ICD-10-CM

## 2010-10-12 ENCOUNTER — Ambulatory Visit (HOSPITAL_COMMUNITY)
Admission: RE | Admit: 2010-10-12 | Discharge: 2010-10-12 | Disposition: A | Discharge status: Home / Self Care | Payer: Medicare HMO | Source: Ambulatory Visit | Attending: Internal Medicine | Admitting: Internal Medicine

## 2010-10-12 ENCOUNTER — Ambulatory Visit (INDEPENDENT_AMBULATORY_CARE_PROVIDER_SITE_OTHER): Payer: Medicare HMO | Admitting: *Deleted

## 2010-10-12 ENCOUNTER — Encounter: Payer: Self-pay | Admitting: *Deleted

## 2010-10-12 ENCOUNTER — Encounter (HOSPITAL_COMMUNITY): Payer: Self-pay

## 2010-10-12 DIAGNOSIS — I4891 Unspecified atrial fibrillation: Secondary | ICD-10-CM

## 2010-10-12 DIAGNOSIS — I4892 Unspecified atrial flutter: Secondary | ICD-10-CM

## 2010-10-12 DIAGNOSIS — I480 Paroxysmal atrial fibrillation: Secondary | ICD-10-CM

## 2010-10-12 DIAGNOSIS — I10 Essential (primary) hypertension: Secondary | ICD-10-CM

## 2010-10-12 DIAGNOSIS — Z7901 Long term (current) use of anticoagulants: Secondary | ICD-10-CM

## 2010-10-12 LAB — CBC WITH DIFFERENTIAL/PLATELET
Basophils Absolute: 0 10*3/uL (ref 0.0–0.1)
Eosinophils Absolute: 0.1 10*3/uL (ref 0.0–0.7)
HCT: 37.6 % — ABNORMAL LOW (ref 39.0–52.0)
Hemoglobin: 12.5 g/dL — ABNORMAL LOW (ref 13.0–17.0)
Lymphs Abs: 1.2 10*3/uL (ref 0.7–4.0)
MCHC: 33.2 g/dL (ref 30.0–36.0)
Monocytes Absolute: 0.7 10*3/uL (ref 0.1–1.0)
Neutro Abs: 3.7 10*3/uL (ref 1.4–7.7)
Platelets: 199 10*3/uL (ref 150.0–400.0)
RDW: 15.3 % — ABNORMAL HIGH (ref 11.5–14.6)

## 2010-10-12 LAB — BASIC METABOLIC PANEL
BUN: 14 mg/dL (ref 6–23)
CO2: 26 mEq/L (ref 19–32)
Calcium: 9 mg/dL (ref 8.4–10.5)
Glucose, Bld: 89 mg/dL (ref 70–99)
Potassium: 4.3 mEq/L (ref 3.5–5.1)
Sodium: 139 mEq/L (ref 135–145)

## 2010-10-12 LAB — PROTIME-INR: Prothrombin Time: 25.1 s — ABNORMAL HIGH (ref 10.2–12.4)

## 2010-10-12 MED ORDER — IOHEXOL 350 MG/ML SOLN
80.0000 mL | Freq: Once | INTRAVENOUS | Status: AC | PRN
  Administered 2010-10-12: 80 mL via INTRAVENOUS

## 2010-10-18 ENCOUNTER — Ambulatory Visit (HOSPITAL_COMMUNITY)
Admission: RE | Admit: 2010-10-18 | Discharge: 2010-10-18 | Disposition: A | Discharge status: Home / Self Care | Payer: Medicare HMO | Source: Ambulatory Visit | Attending: Cardiology | Admitting: Cardiology

## 2010-10-18 DIAGNOSIS — I10 Essential (primary) hypertension: Secondary | ICD-10-CM | POA: Insufficient documentation

## 2010-10-18 DIAGNOSIS — I359 Nonrheumatic aortic valve disorder, unspecified: Secondary | ICD-10-CM | POA: Insufficient documentation

## 2010-10-18 DIAGNOSIS — I059 Rheumatic mitral valve disease, unspecified: Secondary | ICD-10-CM | POA: Insufficient documentation

## 2010-10-18 DIAGNOSIS — I4891 Unspecified atrial fibrillation: Secondary | ICD-10-CM | POA: Insufficient documentation

## 2010-10-19 ENCOUNTER — Inpatient Hospital Stay (HOSPITAL_COMMUNITY)
Admission: RE | Admit: 2010-10-19 | Discharge: 2010-10-20 | DRG: 251 | Disposition: A | Discharge status: Home / Self Care | Payer: Medicare HMO | Source: Ambulatory Visit | Attending: Internal Medicine | Admitting: Internal Medicine

## 2010-10-19 DIAGNOSIS — Z01812 Encounter for preprocedural laboratory examination: Secondary | ICD-10-CM

## 2010-10-19 DIAGNOSIS — I4891 Unspecified atrial fibrillation: Secondary | ICD-10-CM

## 2010-10-19 DIAGNOSIS — Z7901 Long term (current) use of anticoagulants: Secondary | ICD-10-CM

## 2010-10-19 DIAGNOSIS — I10 Essential (primary) hypertension: Secondary | ICD-10-CM | POA: Diagnosis present

## 2010-10-19 LAB — POCT ACTIVATED CLOTTING TIME
Activated Clotting Time: 397 seconds
Activated Clotting Time: 182 seconds
Activated Clotting Time: 320 seconds
Activated Clotting Time: 171 seconds
Activated Clotting Time: 182 seconds

## 2010-10-20 LAB — PROTIME-INR: Prothrombin Time: 25.1 seconds — ABNORMAL HIGH (ref 11.6–15.2)

## 2010-10-22 NOTE — Consult Note (Signed)
  NAME:  John Sheppard, John Sheppard             ACCOUNT NO.:  [id number]  MEDICAL RECORD NO.:  [id number]  LOCATION:  [facility]                         FACILITY:  [facility]  PHYSICIAN:  Noralyn Pick. Eden Emms, MD, FACCDATE OF BIRTH:  Jun 14, 1940  DATE OF CONSULTATION: DATE OF DISCHARGE:                                TEE   A 70 year old patient, pre AFib ablation.  The patient was sedated with 50 mcg of fentanyl and 5 mg of Versed.  Using digital technique, an Omniplane probe was advanced into the esophagus without incidence.  Left jugular cavity size and function were normal, EF was 60%.  There was no mural apical thrombus.  Mitral valve was mildly thickened with mild MR.  There was mild left atrial enlargement.  Atrial septum was intact with no PFO.  Right-sided cardiac chambers were normal.  Aortic valve was trileaflet normal.  Tricuspid and pulmonary valves were normal.  Left atrial appendage was well visualized in orthogonal views.  There was no spontaneous contrast and no thrombus.  Imaging of the aorta showed no debris.  FINAL IMPRESSION: 1. No left atrial appendage thrombus.  The patient should be fine for     atrial fibrillation ablation. 2. Mild left atrial enlargement. 3. Normal left ventricle, ejection fraction 60%. 4. Normal right-sided cardiac chambers. 5. Normal aortic valve. 6. Normal descending thoracic aorta.  The patient tolerated the procedure well.     Noralyn Pick. Eden Emms, MD, [facility]     PCN/MEDQ  D:  10/18/2010  T:  10/18/2010  Job:  [id number]  Electronically Signed by Charlton Haws MD [facility] on 10/22/2010 10:11:27 AM

## 2010-10-23 ENCOUNTER — Ambulatory Visit: Payer: Medicare HMO | Admitting: Cardiology

## 2010-10-24 ENCOUNTER — Ambulatory Visit: Payer: Medicare HMO | Admitting: Internal Medicine

## 2010-11-15 NOTE — Discharge Summary (Addendum)
  NAME:  KNOX, CERVI NO.:  [id number]  MEDICAL RECORD NO.:  [id number]  LOCATION:  [facility]                         FACILITY:  [facility]  PHYSICIAN:  Hillis Range, MD       DATE OF BIRTH:  01-07-1941  DATE OF ADMISSION:  10/18/2010 DATE OF DISCHARGE:  10/18/2010                              DISCHARGE SUMMARY   ADMISSION DIAGNOSIS:  Paroxysmal atrial fibrillation.  SECONDARY DIAGNOSIS:  Hypertension.  PROCEDURES:  EP study and radiofrequency ablation for atrial fibrillation.  CONSULTS:  None.  HISTORY OF PRESENT ILLNESS:  Mr. Cedano is a pleasant 70 year old gentleman with a history of symptomatic paroxysmal atrial fibrillation. He previously underwent cavotricuspid isthmus ablation and pulmonary vein isolation on October 20, 2009.  He did well initially following ablation.  Unfortunately, he has developed recurrent symptomatic atrial fibrillation.  He, therefore, presents for EP study and radiofrequency ablation.  HOSPITAL COURSE:  Mr. Rommel presented to the electrophysiology lab in normal sinus rhythm.  He was adequately sedated by Anesthesia.  EP study was performed.  This demonstrated that he had return of electrical conduction predominantly within the left superior pulmonary vein.  He developed atrial fibrillation with catheter manipulation within the left atrium.  His atrial fibrillation terminated upon isolation of the left superior pulmonary vein.  He was also noted to have electrical activity within the right inferior pulmonary vein which was also ablated.  The right superior and left inferior pulmonary vein were largely quiescent from a prior ablation procedure.  With isoproterenol infusion, he had premature atrial contractions in a bigeminal pattern which were mapped to the ostium of the right superior pulmonary vein and were successfully ablated in this location.  He then underwent electrical encircling at the junction of the superior vena cava  and right atrium.  He was confirmed to have cavotricuspid isthmus block from a prior ablation procedure.  Following ablation, he was observed overnight in the step- down unit of the ICU.  This was without event.  He maintained sinus rhythm and had no difficulties.  At the time of discharge, the patient was alert, ambulatory, and otherwise without complaint.  DISPOSITION:  Home.  DISCHARGE CONDITION:  Good.  DISCHARGE DIET:  Cardiac prudent.  DISCHARGE INSTRUCTIONS:  No heavy lifting or rigorous activities for 1 week.  He will increase activities as follows.  DISCHARGE MEDICATIONS: 1. Coumadin to maintain an INR between 2 and 3 as per his prior home     regimen. 2. Sotalol 160 mg twice daily. 3. Ocuvite twice daily. 4. Multivitamin daily. 5. Fish oil 1000 mg at bedtime. 6. Protonix 40 mg daily for 6 weeks.  FOLLOWUP:  The patient will follow up with Dr. Johney Frame in 12 weeks.     Hillis Range, MD   ______________________________ Hillis Range, MD    JA/MEDQ  D:  10/20/2010  T:  10/20/2010  Job:  [id number]  Electronically Signed by Hillis Range MD on 11/15/2010 08:41:19 AM

## 2010-11-15 NOTE — Op Note (Signed)
John Sheppard, John Sheppard NO.:  [id number]  MEDICAL RECORD NO.:  [id number]  LOCATION:  2918                         FACILITY:  [facility]  PHYSICIAN:  Hillis Range, MD       DATE OF BIRTH:  [birth year]  DATE OF PROCEDURE: DATE OF DISCHARGE:                              OPERATIVE REPORT   PREPROCEDURE DIAGNOSIS:  Paroxysmal atrial fibrillation.  POSTPROCEDURE DIAGNOSES:  Paroxysmal atrial fibrillation.  PROCEDURES: 1. Comprehensive EP study. 2. Coronary sinus pacing and recording. 3. A 3-D mapping of SVT. 4. Radiofrequency ablation of SVT. 5. Arterial blood pressure monitoring. 6. Intracardiac echocardiography. 7. Transseptal puncture of an intact septum. 8. Pulmonary vein venography. 9. Isoproterenol infusion.  INTRODUCTION:  John Sheppard is a pleasant 70 year old gentleman with a history of symptomatic paroxysmal atrial fibrillation.  He has previously failed medical therapy, most recently with sotalol.  He previously underwent atrial fibrillation ablation by me on October 20, 2009.  He did well initially following ablation.  Unfortunately he has developed recurrent symptomatic atrial fibrillation.  He therefore presents today for repeat catheter ablation.  DESCRIPTION OF PROCEDURE:  Informed written consent was obtained and the patient was brought to the electrophysiology lab in the fasting state. He was adequately sedated with intravenous medications as outlined in the anesthesia report.  The patient's right and left groins were prepped and draped in the usual sterile fashion by the EP lab staff.  Using a percutaneous Seldinger technique, two 7-French and one 8-French hemostasis sheaths were placed into the right common femoral vein.  An 11-French hemostasis sheath was placed into the left common femoral vein.  A 4-French hemostasis sheath was placed in the right common femoral artery for blood pressure monitoring.  A 7-French Biosense Webster Decapolar  coronary sinus catheter was introduced through the right common femoral vein and advanced into the coronary sinus for recording and pacing from this location.  A 6-French quadripolar Josephson catheter was introduced through the right common femoral vein and advanced into the right ventricle for recording and pacing.  This catheter was then pulled back to the his bundle location.  The patient presented to the electrophysiology lab in normal sinus rhythm.  His PR interval measured 163 milliseconds with a QRS duration of 89 milliseconds and a QT interval of 472 milliseconds.  His AH interval measured 89 milliseconds with an HV interval of 55 milliseconds. Ventricular pacing was performed which revealed VA dissociation at baseline.  A 10-French Biosense Webster AcuNav intracardiac echocardiography catheter was introduced through the left common femoral vein and advanced into the right atrium.  Intracardiac echocardiography was performed which demonstrated a moderate-sized left atrium.  There were 4 separate pulmonary veins which were moderate in size.  There was no evidence of pulmonary vein stenosis.  The middle right common femoral vein sheath was then exchanged for an 8.5 [place] SL2 transseptal sheath and transseptal access was achieved in a standard fashion using a Brockenbrough needle under biplane fluoroscopy with intracardiac echocardiography confirmation of the transseptal puncture.  Once transseptal access had been achieved, heparin was administered intravenously and intra-arterially with a target ACT of greater than 400 seconds throughout the procedure.  A 6-French multipurpose angiographic catheter with  guidewire was introduced through the transseptal sheath and positioned over the mouth of all 4 pulmonary veins.  Pulmonary venograms were performed by hand injection of nonionic contrast and demonstrated moderate-sized pulmonary veins with no evidence of pulmonary vein stenosis.   The angiographic catheter was removed.  The his bundle catheter was removed and in its place a 3.5 mm Biosense Webster EZ [employer] ablation catheter was advanced into the right atrium.  The transseptal sheath was pulled back into the IVC over a guidewire.  The ablation catheter was advanced across the transseptal hole using the wire as a guide.  The transseptal sheath was then re- advanced over the guidewire into the left atrium.  A dual Decapolar circular mapping catheter was introduced through the transseptal sheath and positioned over the mouth of all 4 pulmonary veins.  Three- dimensional electrode anatomical mapping was performed using Government social research officer.  Carto merge and Carto sound technologies were both used in order to reconstruct a left atrial shell using a previously acquired CT angiogram of the left atrium as well as real time intracardiac echocardiography.  The patient was noted to have return of conduction which was quite prodigious within the left superior pulmonary vein. There was also return of conduction within the right inferior pulmonary vein.  With catheter manipulation, the patient went into atrial fibrillation.  Upon isolation of the left superior pulmonary vein, atrial fibrillation immediately terminated.  The patient remained in sinus rhythm thereafter.  Additional ablation was performed along the carina between the left superior and left inferior pulmonary vein.  The left inferior pulmonary vein and right superior pulmonary vein were noted to be quiescent from the prior ablation procedure.  Isoproterenol was then infused at 20 mcg per minute.  During isoproterenol infusion, the patient was observed to have premature atrial contractions in a bigeminal pattern.  Three-dimensional electrode anatomical mapping within the left atrium was performed which revealed that these PACs were arising just outside the ostium of the right superior pulmonary vein. The PAC  focus was therefore ablated in this location.  The circular mapping catheter was then positioned back into the right atrium at the junction of the superior vena cava and right atrium.  A series of radiofrequency applications were delivered in a circular fashion at the junction of the SVC and right atrium.  Prior to each ablation lesion, pacing from the distal ablation electrode was performed at maximum output.  No ablation lesions were delivered in areas where diaphragmatic stimulation was observed.  Each frequency lesion was delivered for 15 seconds at 30 watts with a target temperature of less than 40 degrees. During ablation, fluoroscopic visualization of the right hemidiaphragm was also performed and there was no evidence of diaphragmatic injury during the procedure.  Following encircling of the junction of the SVC and right atrium, the ablation catheter was positioned in the low lateral right atrium.  The circular mapping catheter was removed and in its place a 7-French dual Decapolar halo catheter was advanced into the right atrium.  This catheter was positioned around the tricuspid valve annulus.  Differential atrial pacing was performed from the low lateral right atrium which confirmed complete bidirectional cavotricuspid isthmus block from the prior ablation procedure.  Atrial pacing was performed which revealed an AV Wenckebach cycle length of less than 600 milliseconds.  Following ablation, the AH interval measured 94 milliseconds with an HV interval of 53 milliseconds.  The procedure was therefore considered completed.  All catheters were removed and the sheaths were  aspirated and flushed.  Intracardiac echocardiography was again performed which revealed no pericardial effusion.  The patient was transferred to the recovery area for sheath removal per protocol.  A limited bedside transthoracic echocardiogram revealed no pericardial effusion.  There were no early apparent  complications.  CONCLUSIONS: 1. Sinus rhythm upon presentation. 2. Return of electrical conduction within the left superior and right     inferior pulmonary veins.  These veins were reisolated and     anatomically encircled using radiofrequency current.  The patient     developed atrial fibrillation with catheter manipulation and atrial     fibrillation terminated upon isolation of the left superior     pulmonary vein.  The left inferior and right superior pulmonary     veins were quiescent from prior ablation procedure. 3. Premature atrial contractions were induced with Isuprel and were     successfully ablated at the ostium of the right superior pulmonary     vein. 4. Cavotricuspid isthmus block was confirmed from a prior ablation     procedure. 5. Electrical encircling using radiofrequency current at the junction     of the superior vena cava and right atrium. 6. No early apparent complications.     Hillis Range, MD     JA/MEDQ  D:  10/19/2010  T:  10/19/2010  Job:  [id number]  cc:   Peter M. [place], M.D.  Electronically Signed by Hillis Range MD on 11/15/2010 08:41:21 AM

## 2011-01-23 ENCOUNTER — Encounter: Payer: Self-pay | Admitting: Internal Medicine

## 2011-01-23 ENCOUNTER — Ambulatory Visit (INDEPENDENT_AMBULATORY_CARE_PROVIDER_SITE_OTHER): Payer: Medicare HMO | Admitting: Internal Medicine

## 2011-01-23 VITALS — BP 132/98 | HR 62 | Ht 72.0 in | Wt 191.0 lb

## 2011-01-23 DIAGNOSIS — I480 Paroxysmal atrial fibrillation: Secondary | ICD-10-CM

## 2011-01-23 DIAGNOSIS — I4891 Unspecified atrial fibrillation: Secondary | ICD-10-CM

## 2011-01-23 NOTE — Assessment & Plan Note (Signed)
Doing well s/p ablation No changes today  If no further afib upon return, we will stop sotalol Continue coumadin

## 2011-01-23 NOTE — Progress Notes (Signed)
PCP:  Garlan Fillers, MD, MD  The patient presents today for routine electrophysiology followup.  Since his recent afib ablation, the patient reports doing very well.  He denies any further afib.  He denies procedure related problems.  Today, he denies symptoms of palpitations, chest pain, shortness of breath, orthopnea, PND, lower extremity edema, dizziness, presyncope, syncope, or neurologic sequela.  The patient feels that he is tolerating medications without difficulties and is otherwise without complaint today.   Past Medical History  Diagnosis Date  . Paroxysmal atrial fibrillation     s/p PVI 10/20/09, 10/19/10  . Atrial flutter     s/p CTI ablation 10/20/09  . Hypertension   . Macular degeneration   . Hyperthyroidism    Past Surgical History  Procedure Date  . Cardioversion     2001  . Transesophageal echocardiogram     9/11  EF: 60-65%  . Atrial ablation surgery     PVI and CTI ablation by JA 10/20/09 and 10/19/10  . Cataract extraction     july, 2012    Current Outpatient Prescriptions  Medication Sig Dispense Refill  . Multiple Vitamin (MULTIVITAMIN) tablet Take 1 tablet by mouth daily.        . Multiple Vitamins-Minerals (OCUVITE PO) Take by mouth daily.        . sotalol (BETAPACE) 160 MG tablet TAKE 1 TABLET TWICE DAILY  180 tablet  3  . warfarin (COUMADIN) 7.5 MG tablet Take 4 mg by mouth as directed.         No Known Allergies  History   Social History  . Marital Status: Married    Spouse Name: N/A    Number of Children: N/A  . Years of Education: N/A   Occupational History  . Not on file.   Social History Main Topics  . Smoking status: Never Smoker   . Smokeless tobacco: Not on file  . Alcohol Use: 1.2 oz/week    2 Cans of beer per week     2 BEERS WKLY  . Drug Use: No  . Sexually Active: Not on file   Other Topics Concern  . Not on file   Social History Narrative  . No narrative on file    Family History  Problem Relation Age of Onset  .  Heart failure Father     DECEASED   Physical Exam: Filed Vitals:   01/23/11 1534  BP: 132/98  Pulse: 62  Height: 6' (1.829 m)  Weight: 191 lb (86.637 kg)    GEN- The patient is well appearing, alert and oriented x 3 today.   Head- normocephalic, atraumatic Eyes-  Sclera clear, conjunctiva pink Ears- hearing intact Oropharynx- clear Neck- supple, no JVP Lymph- no cervical lymphadenopathy Lungs- Clear to ausculation bilaterally, normal work of breathing Heart- Regular rate and rhythm, no murmurs, rubs or gallops, PMI not laterally displaced GI- soft, NT, ND, + BS Extremities- no clubbing, cyanosis, or edema MS- no significant deformity or atrophy Skin- no rash or lesion Psych- euthymic mood, full affect Neuro- strength and sensation are intact  ekg today reveals sinus rhythm 62 bpm, otherwise normal ekg  Assessment and Plan:

## 2011-01-23 NOTE — Patient Instructions (Signed)
Your physician recommends that you schedule a follow-up appointment in 3 months with Dr Allred    

## 2011-04-29 ENCOUNTER — Encounter: Payer: Self-pay | Admitting: Internal Medicine

## 2011-04-29 ENCOUNTER — Ambulatory Visit (INDEPENDENT_AMBULATORY_CARE_PROVIDER_SITE_OTHER): Payer: Medicare HMO | Admitting: Internal Medicine

## 2011-04-29 VITALS — BP 126/79 | HR 55 | Resp 18 | Ht 72.0 in | Wt 185.4 lb

## 2011-04-29 DIAGNOSIS — I4891 Unspecified atrial fibrillation: Secondary | ICD-10-CM

## 2011-04-29 NOTE — Progress Notes (Signed)
PCP:  Garlan Fillers, MD, MD  The patient presents today for routine electrophysiology followup.  Since his recent afib ablation, the patient reports doing very well.  He denies any further afib. Today, he denies symptoms of palpitations, chest pain, shortness of breath, orthopnea, PND, lower extremity edema, dizziness, presyncope, syncope, or neurologic sequela.  The patient feels that he is tolerating medications without difficulties and is otherwise without complaint today.   Past Medical History  Diagnosis Date  . Paroxysmal atrial fibrillation     s/p PVI 10/20/09, 10/19/10  . Atrial flutter     s/p CTI ablation 10/20/09  . Macular degeneration   . Hyperthyroidism    Past Surgical History  Procedure Date  . Cardioversion     2001  . Transesophageal echocardiogram     9/11  EF: 60-65%  . Atrial ablation surgery     PVI and CTI ablation by JA 10/20/09 and 10/19/10  . Cataract extraction     july, 2012    Current Outpatient Prescriptions  Medication Sig Dispense Refill  . Multiple Vitamin (MULTIVITAMIN) tablet Take 1 tablet by mouth daily.        . Multiple Vitamins-Minerals (OCUVITE PO) Take by mouth daily.        . sotalol (BETAPACE) 160 MG tablet TAKE 1 TABLET TWICE DAILY  180 tablet  3    No Known Allergies  History   Social History  . Marital Status: Married    Spouse Name: N/A    Number of Children: N/A  . Years of Education: N/A   Occupational History  . Not on file.   Social History Main Topics  . Smoking status: Never Smoker   . Smokeless tobacco: Not on file  . Alcohol Use: 1.2 oz/week    2 Cans of beer per week     2 BEERS WKLY  . Drug Use: No  . Sexually Active: Not on file   Other Topics Concern  . Not on file   Social History Narrative  . No narrative on file    Family History  Problem Relation Age of Onset  . Heart failure Father     DECEASED   Physical Exam: Filed Vitals:   04/29/11 1124  BP: 126/79  Pulse: 55  Resp: 18  Height: 6'  (1.829 m)  Weight: 185 lb 6.4 oz (84.097 kg)    GEN- The patient is well appearing, alert and oriented x 3 today.   Head- normocephalic, atraumatic Eyes-  Sclera clear, conjunctiva pink Ears- hearing intact Oropharynx- clear Neck- supple, no JVP Lymph- no cervical lymphadenopathy Lungs- Clear to ausculation bilaterally, normal work of breathing Heart- Regular rate and rhythm, no murmurs, rubs or gallops, PMI not laterally displaced GI- soft, NT, ND, + BS Extremities- no clubbing, cyanosis, or edema MS- no significant deformity or atrophy Skin- no rash or lesion Psych- euthymic mood, full affect Neuro- strength and sensation are intact  ekg today reveals sinus rhythm 51 bpm, QTc 427, otherwise normal ekg  Assessment and Plan:

## 2011-04-29 NOTE — Assessment & Plan Note (Signed)
Doing well s/p ablation without recurrence His CHADS2 score is 0.  He wishes to stop coumadin and start ASA 81mg  daily at this time.  He is not yet ready to stop sotalol.  He will continue to contemplate stopping sotalol and will likely stop it upon return in 6 months if no further afib at that time.

## 2011-04-29 NOTE — Patient Instructions (Signed)
Your physician wants you to follow-up in: 6 months with Dr Jacquiline Doe will receive a reminder letter in the mail two months in advance. If you don't receive a letter, please call our office to schedule the follow-up appointment.  Your physician has recommended you make the following change in your medication:  1) Stop Coumadin 2) Start ASA 81mg  daily

## 2011-08-14 ENCOUNTER — Other Ambulatory Visit: Payer: Self-pay | Admitting: Cardiology

## 2011-10-28 ENCOUNTER — Encounter: Payer: Self-pay | Admitting: Internal Medicine

## 2011-10-28 ENCOUNTER — Ambulatory Visit (INDEPENDENT_AMBULATORY_CARE_PROVIDER_SITE_OTHER): Payer: Medicare HMO | Admitting: Internal Medicine

## 2011-10-28 VITALS — BP 116/72 | HR 59 | Ht 72.0 in | Wt 181.0 lb

## 2011-10-28 DIAGNOSIS — I480 Paroxysmal atrial fibrillation: Secondary | ICD-10-CM

## 2011-10-28 DIAGNOSIS — I4891 Unspecified atrial fibrillation: Secondary | ICD-10-CM

## 2011-10-28 NOTE — Progress Notes (Signed)
PCP:  Garlan Fillers, MD  The patient presents today for routine electrophysiology followup.  He is doing very well.  He denies any further afib. Today, he denies symptoms of palpitations, chest pain, shortness of breath, orthopnea, PND, lower extremity edema, dizziness, presyncope, syncope, or neurologic sequela.  The patient feels that he is tolerating medications without difficulties and is otherwise without complaint today.   Past Medical History  Diagnosis Date  . Paroxysmal atrial fibrillation     s/p PVI 10/20/09, 10/19/10  . Atrial flutter     s/p CTI ablation 10/20/09  . Macular degeneration   . Hyperthyroidism    Past Surgical History  Procedure Date  . Cardioversion     2001  . Transesophageal echocardiogram     9/11  EF: 60-65%  . Atrial ablation surgery     PVI and CTI ablation by JA 10/20/09 and 10/19/10  . Cataract extraction     july, 2012    Current Outpatient Prescriptions  Medication Sig Dispense Refill  . aspirin EC 81 MG tablet Take 1 tablet (81 mg total) by mouth daily.  150 tablet  2  . ferrous sulfate 325 (65 FE) MG tablet Take 325 mg by mouth daily with breakfast.      . Multiple Vitamin (MULTIVITAMIN) tablet Take 1 tablet by mouth daily.        . Multiple Vitamins-Minerals (OCUVITE PO) Take by mouth daily.        . Multiple Vitamins-Minerals (PRESERVISION/LUTEIN PO) Take by mouth daily.      . sotalol (BETAPACE) 160 MG tablet TAKE 1 TABLET TWICE DAILY  180 tablet  2  . DISCONTD: warfarin (COUMADIN) 7.5 MG tablet Take 4 mg by mouth as directed.         No Known Allergies  History   Social History  . Marital Status: Married    Spouse Name: N/A    Number of Children: N/A  . Years of Education: N/A   Occupational History  . Not on file.   Social History Main Topics  . Smoking status: Never Smoker   . Smokeless tobacco: Not on file  . Alcohol Use: 1.2 oz/week    2 Cans of beer per week     2 BEERS WKLY  . Drug Use: No  . Sexually Active: Not on  file   Other Topics Concern  . Not on file   Social History Narrative  . No narrative on file    Family History  Problem Relation Age of Onset  . Heart failure Father     DECEASED   Physical Exam: Filed Vitals:   10/28/11 0955  BP: 116/72  Pulse: 59  Height: 6' (1.829 m)  Weight: 181 lb (82.101 kg)  SpO2: 97%    GEN- The patient is well appearing, alert and oriented x 3 today.   Head- normocephalic, atraumatic Eyes-  Sclera clear, conjunctiva pink Ears- hearing intact Oropharynx- clear Neck- supple, no JVP Lymph- no cervical lymphadenopathy Lungs- Clear to ausculation bilaterally, normal work of breathing Heart- Regular rate and rhythm, no murmurs, rubs or gallops, PMI not laterally displaced GI- soft, NT, ND, + BS Extremities- no clubbing, cyanosis, or edema MS- no significant deformity or atrophy Skin- no rash or lesion Psych- euthymic mood, full affect Neuro- strength and sensation are intact  ekg today reveals sinus rhythm 59 bpm, QTc 409, otherwise normal ekg  Assessment and Plan:

## 2011-10-28 NOTE — Patient Instructions (Addendum)
Your physician wants you to follow-up in: 12 months with Dr Allred You will receive a reminder letter in the mail two months in advance. If you don't receive a letter, please call our office to schedule the follow-up appointment.  

## 2011-10-28 NOTE — Assessment & Plan Note (Signed)
Doing well s/p ablation without recurrence His CHADS2 score is 0.  He is therefore off of coumadin  He is still not yet ready to stop sotalol.   No changes today  Return in 1 year

## 2011-12-26 ENCOUNTER — Encounter: Payer: Self-pay | Admitting: Internal Medicine

## 2012-01-31 ENCOUNTER — Ambulatory Visit (AMBULATORY_SURGERY_CENTER): Payer: Medicare HMO | Admitting: *Deleted

## 2012-01-31 VITALS — Ht 72.0 in | Wt 188.2 lb

## 2012-01-31 DIAGNOSIS — Z1211 Encounter for screening for malignant neoplasm of colon: Secondary | ICD-10-CM

## 2012-01-31 MED ORDER — MOVIPREP 100 G PO SOLR: ORAL | Status: DC

## 2012-02-20 ENCOUNTER — Ambulatory Visit (AMBULATORY_SURGERY_CENTER): Payer: Medicare HMO | Admitting: Internal Medicine

## 2012-02-20 ENCOUNTER — Encounter: Payer: Self-pay | Admitting: Internal Medicine

## 2012-02-20 VITALS — BP 127/89 | HR 68 | Temp 98.4°F | Resp 13 | Ht 72.0 in | Wt 188.0 lb

## 2012-02-20 DIAGNOSIS — Z1211 Encounter for screening for malignant neoplasm of colon: Secondary | ICD-10-CM

## 2012-02-20 DIAGNOSIS — D126 Benign neoplasm of colon, unspecified: Secondary | ICD-10-CM

## 2012-02-20 MED ORDER — SODIUM CHLORIDE 0.9 % IV SOLN: 500.0000 mL | INTRAVENOUS | Status: DC

## 2012-02-20 NOTE — Progress Notes (Signed)
Called to room to assist during endoscopic procedure.  Patient ID and intended procedure confirmed with present staff. Received instructions for my participation in the procedure from the performing physician.  

## 2012-02-20 NOTE — Op Note (Signed)
[facility] [address] [place], [address]   COLONOSCOPY PROCEDURE REPORT  PATIENT: [name], [name]  MR#: [id number] BIRTHDATE: 1940/02/22 , 71  yrs. old GENDER: Male ENDOSCOPIST: Roxy Cedar, MD REFERRED XB:[id number] Eloise Harman, M.D. PROCEDURE DATE:  02/20/2012 PROCEDURE:   Colonoscopy with snare polypectomy  x 7 and Submucosal injection sailine and tattoo ink EXTENDED SERVICE MODIFIER (TIME AND TECHNICAL) ASA CLASS:   Class II INDICATIONS:average risk screening. MEDICATIONS: MAC sedation, administered by CRNA and propofol (Diprivan) 600mg  IV  DESCRIPTION OF PROCEDURE:   After the risks benefits and alternatives of the procedure were thoroughly explained, informed consent was obtained.  A digital rectal exam revealed no abnormalities of the rectum.   The LB CF-[id number]  endoscope was introduced through the anus and advanced to the cecum, which was identified by both the appendix and ileocecal valve. No adverse events experienced.   The quality of the prep was adequate, using MoviPrep  The instrument was then slowly withdrawn as the colon was fully examined.      COLON FINDINGS: Six polyps ranging between 5-27mm in size were found at the cecum, in the ascending colon (9mm sessile), transverse colon, descending colon, and rectum.  A polypectomies were performed with a cold snare.  The resection was complete and the polyp tissue was completely retrieved.  A sessile polyp measuring 25 mm in size was found in the transverse colon.  A saline Injection was given to lift the mucosal wall.  A piecemeal polypectomy was performed using snare cautery.  Injection (tattooing) was performed with marking a few cm distal to the lesion.  Retroflexed views revealed internal hemorrhoids. The time to cecum=7 minutes 03 seconds.  Withdrawal time=65 minutes 22 seconds.  The scope was withdrawn and the procedure completed. COMPLICATIONS: There were no  complications.  ENDOSCOPIC IMPRESSION: 1.   Six polyps ranging between 5-35mm in size were found at the cecum, in the ascending colon, transverse colon, descending colon, and rectum; polypectomy was performed with a cold snare; polypectomy was performed 2.   Sessile polyp measuring 25 mm in size was found in the transverse colon; saline was given to lift the mucosal wall.; polypectomy was performed using snare cautery; Injection (tattooing) was performed  RECOMMENDATIONS: 1. Await pathology results   eSigned:  Roxy Cedar, MD 02/20/2012 9:54 AM   cc: Jarome Matin, MD and The Patient   PATIENT NAME:  John Sheppard, John Sheppard MR#: [id number]

## 2012-02-20 NOTE — Progress Notes (Signed)
Patient did not experience any of the following events: a burn prior to discharge; a fall within the facility; wrong site/side/patient/procedure/implant event; or a hospital transfer or hospital admission upon discharge from the facility. (G8907) Patient did not have preoperative order for IV antibiotic SSI prophylaxis. (G8918)  

## 2012-02-20 NOTE — Patient Instructions (Addendum)
YOU HAD AN ENDOSCOPIC PROCEDURE TODAY AT THE [facility]: Refer to the procedure report that was given to you for any specific questions about what was found during the examination.  If the procedure report does not answer your questions, please call your gastroenterologist to clarify.  If you requested that your care partner not be given the details of your procedure findings, then the procedure report has been included in a sealed envelope for you to review at your convenience later.  YOU SHOULD EXPECT: Some feelings of bloating in the abdomen. Passage of more gas than usual.  Walking can help get rid of the air that was put into your GI tract during the procedure and reduce the bloating. If you had a lower endoscopy (such as a colonoscopy or flexible sigmoidoscopy) you may notice spotting of blood in your stool or on the toilet paper. If you underwent a bowel prep for your procedure, then you may not have a normal bowel movement for a few days.  DIET: Your first meal following the procedure should be a light meal and then it is ok to progress to your normal diet.  A half-sandwich or bowl of soup is an example of a good first meal.  Heavy or fried foods are harder to digest and may make you feel nauseous or bloated.  Likewise meals heavy in dairy and vegetables can cause extra gas to form and this can also increase the bloating.  Drink plenty of fluids but you should avoid alcoholic beverages for 24 hours.  ACTIVITY: Your care partner should take you home directly after the procedure.  You should plan to take it easy, moving slowly for the rest of the day.  You can resume normal activity the day after the procedure however you should NOT DRIVE or use heavy machinery for 24 hours (because of the sedation medicines used during the test).    SYMPTOMS TO REPORT IMMEDIATELY: A gastroenterologist can be reached at any hour.  During normal business hours, 8:30 AM to 5:00 PM Monday through Friday,  call [phone].  After hours and on weekends, please call the GI answering service at [phone] who will take a message and have the physician on call contact you.   Following lower endoscopy (colonoscopy or flexible sigmoidoscopy):  Excessive amounts of blood in the stool  Significant tenderness or worsening of abdominal pains  Swelling of the abdomen that is new, acute  Fever of 100F or higher  FOLLOW UP: If any biopsies were taken you will be contacted by phone or by letter within the next 1-3 weeks.  Call your gastroenterologist if you have not heard about the biopsies in 3 weeks.  Our staff will call the home number listed on your records the next business day following your procedure to check on you and address any questions or concerns that you may have at that time regarding the information given to you following your procedure. This is a courtesy call and so if there is no answer at the home number and we have not heard from you through the emergency physician on call, we will assume that you have returned to your regular daily activities without incident.  SIGNATURES/CONFIDENTIALITY: You and/or your care partner have signed paperwork which will be entered into your electronic medical record.  These signatures attest to the fact that that the information above on your After Visit Summary has been reviewed and is understood.  Full responsibility of the confidentiality of this   discharge information lies with you and/or your care-partner.    Resume medications. Information given on polyps with discharge instructions. 

## 2012-02-20 NOTE — Progress Notes (Signed)
0930 a/ox3 pleased with MAC report to [employer]

## 2012-02-21 ENCOUNTER — Telehealth: Payer: Self-pay | Admitting: *Deleted

## 2012-02-21 NOTE — Telephone Encounter (Signed)
  Follow up Call-  Call back number 02/20/2012  Post procedure Call Back phone  # [phone]  Permission to leave phone message Yes     Patient questions:  Do you have a fever, pain , or abdominal swelling? no Pain Score  0 *  Have you tolerated food without any problems? yes  Have you been able to return to your normal activities? yes  Do you have any questions about your discharge instructions: Diet   no Medications  no Follow up visit  no  Do you have questions or concerns about your Care? no  Actions: * If pain score is 4 or above: No action needed, pain <4.

## 2012-02-25 ENCOUNTER — Encounter: Payer: Self-pay | Admitting: Internal Medicine

## 2012-05-06 ENCOUNTER — Other Ambulatory Visit: Payer: Self-pay | Admitting: Cardiology

## 2012-05-06 MED ORDER — SOTALOL HCL 160 MG PO TABS: 160.0000 mg | ORAL_TABLET | Freq: Two times a day (BID) | ORAL | Status: DC

## 2012-06-15 ENCOUNTER — Other Ambulatory Visit: Payer: Self-pay | Admitting: *Deleted

## 2012-06-15 MED ORDER — SOTALOL HCL 160 MG PO TABS: 160.0000 mg | ORAL_TABLET | Freq: Two times a day (BID) | ORAL | Status: DC

## 2012-07-09 ENCOUNTER — Encounter: Payer: Self-pay | Admitting: Internal Medicine

## 2012-09-05 IMAGING — CT CT HEART MORP W/ CTA COR W/ SCORE W/ CA W/CM &/OR W/O CM
2 series · 16 of 20 positions shown, 18 images · IV contrast (CONTRAST)
Comparison: None.

OVER-READ INTERPRETATION - CT CHEST

The following report is an over-read performed by radiologist Dr.
[DATE].  This over-read does not include interpretation of
cardiac or coronary anatomy or pathology.  The CTA interpretation
by the cardiologist is attached.
INDICATION: Pre atrial fibrillation ablation.  Assess pulmonary
veings
PROTOCOL: The patient was attempted to be done 2 days previously
but the scanner gating system was not working.  At that time he had
rapid PAF despite amiodarone and 15mg of lopresser.  Dr Laurie
supervised this exam.  He appeared to be in [place].  However we
decided to use a retrospective protocol due to the instability of
his rhythm.  The patient received 80cc of contrast.  Gantry
rotation speed was 066msec.  Collimation was .9mm.  idose was [DATE].  The scan was triggered in the descending thoracic aorta at
111 HU.  The 3D data set was sent to a [facility] portal for
interpretation

[75.0% · axial · 0.98mm/px · z∈[-249,-144]mm · 8 of 47 slices shown]
[im 6/47  vessel]
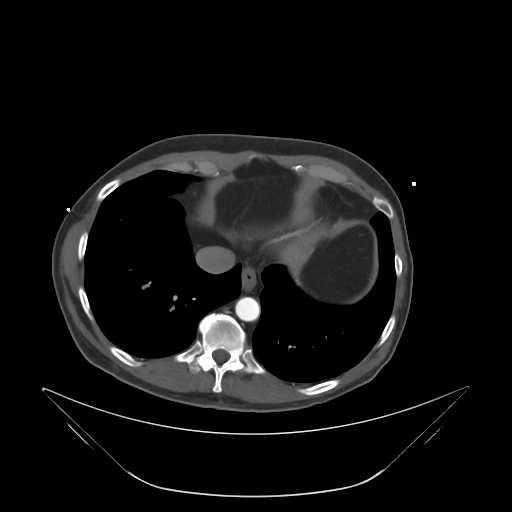
[im 11/47  vessel]
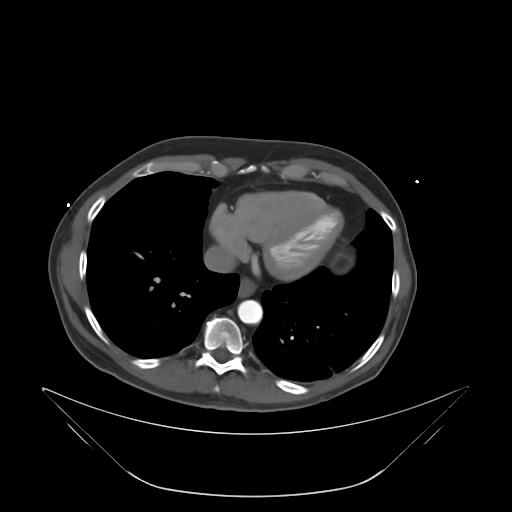
[im 16/47  vessel]
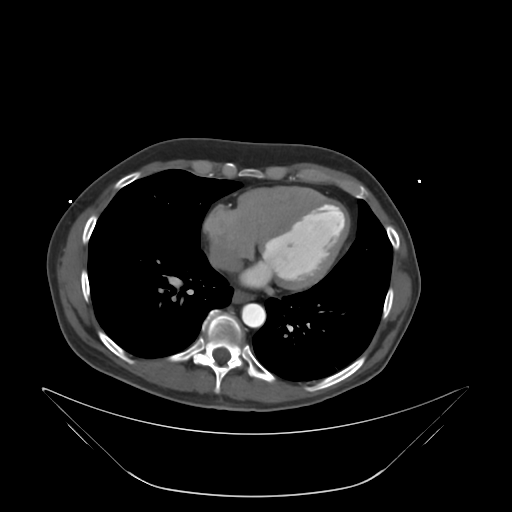
[im 21/47  vessel]
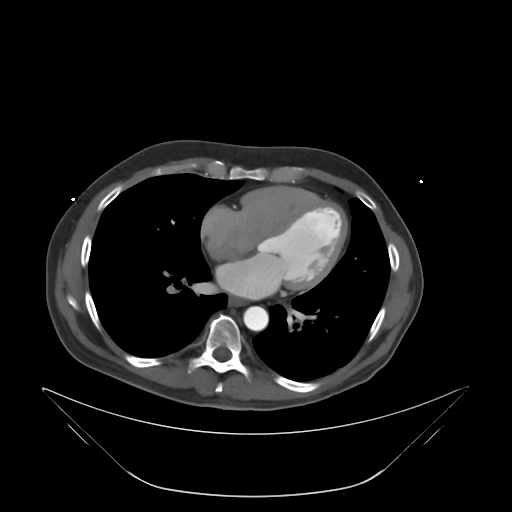
[im 26/47  vessel]
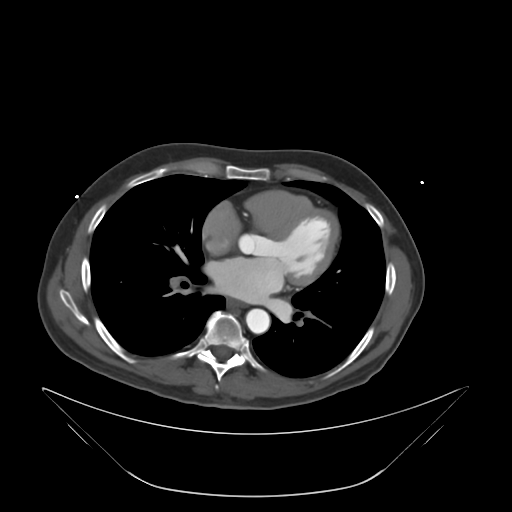
[im 31/47  vessel]
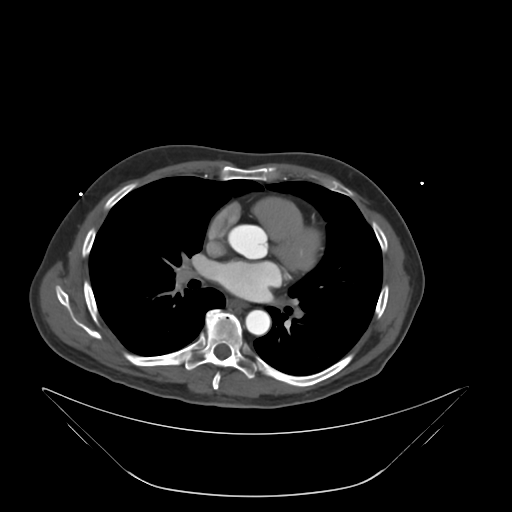
[im 36/47  vessel]
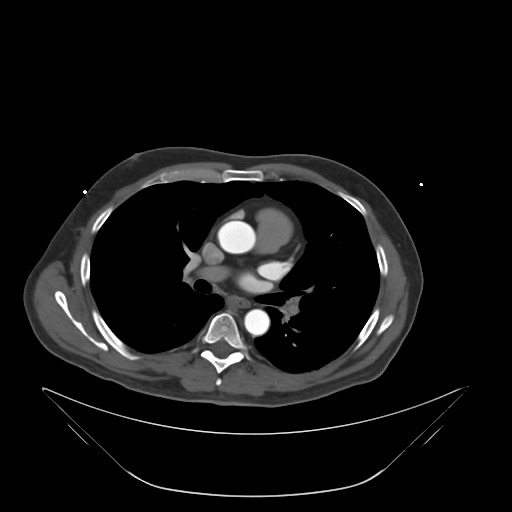
[im 41/47  vessel]
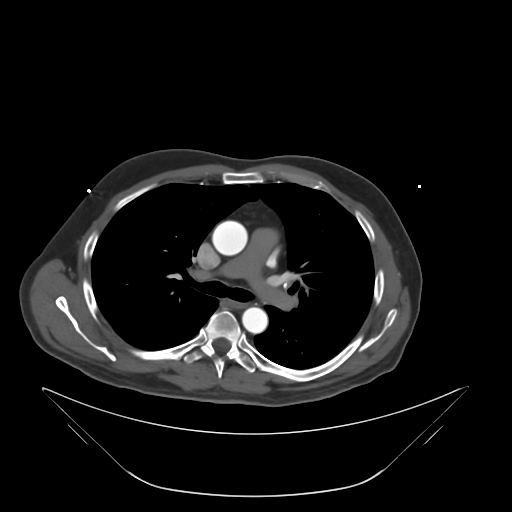

[mpr, axial, axial · axial · 0.49mm/px · z∈[-248,-143]mm · 8 of 46 slices shown, 10 images]
[im 6/46  vessel]
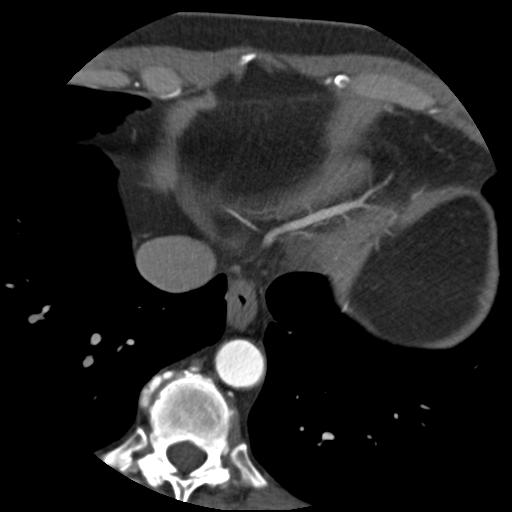
[im 6/46  lung]
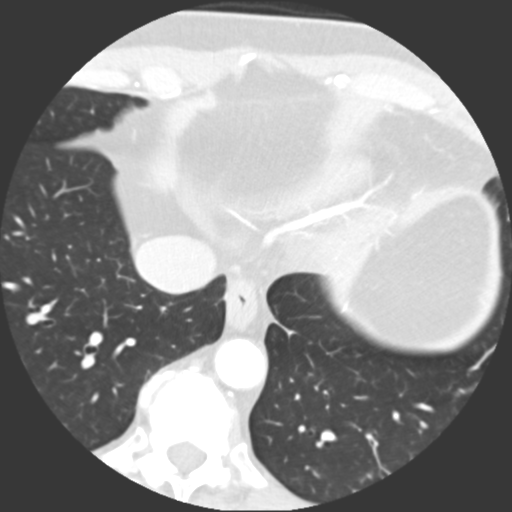
[im 11/46  vessel]
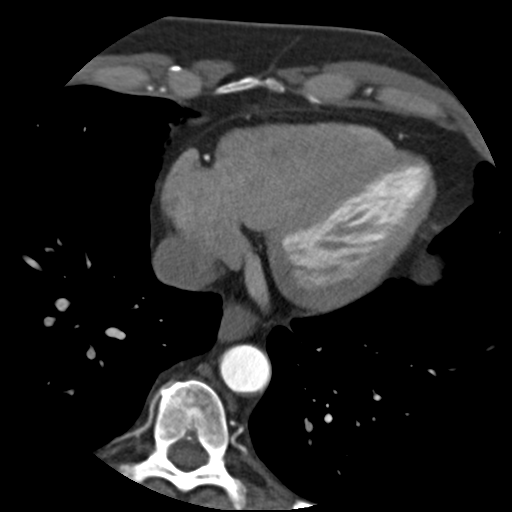
[im 16/46  vessel]
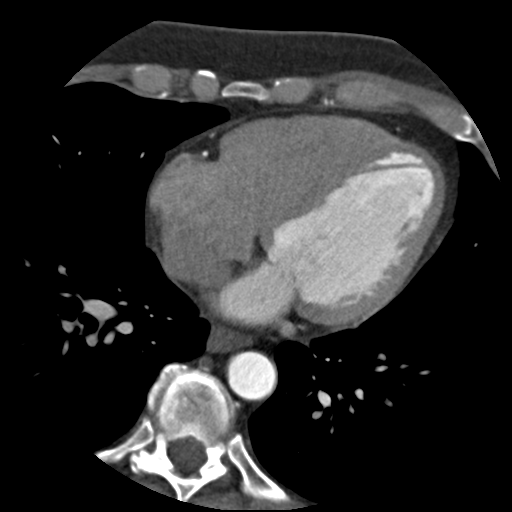
[im 21/46  vessel]
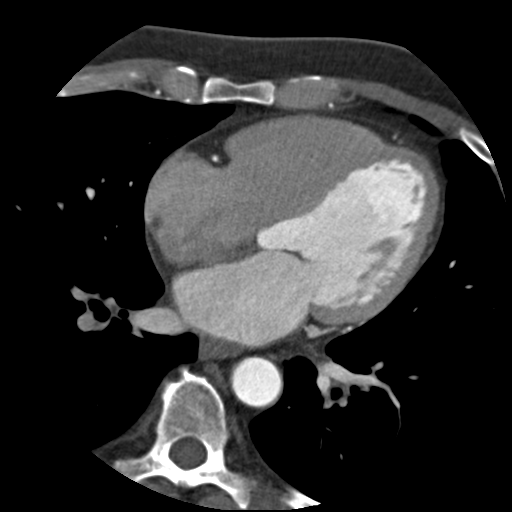
[im 26/46  vessel]
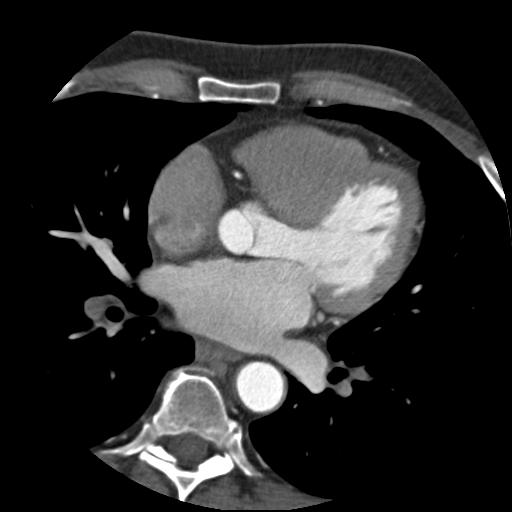
[im 26/46  lung]
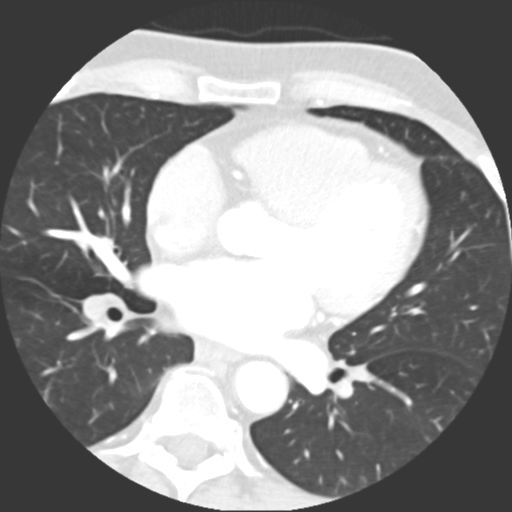
[im 31/46  vessel]
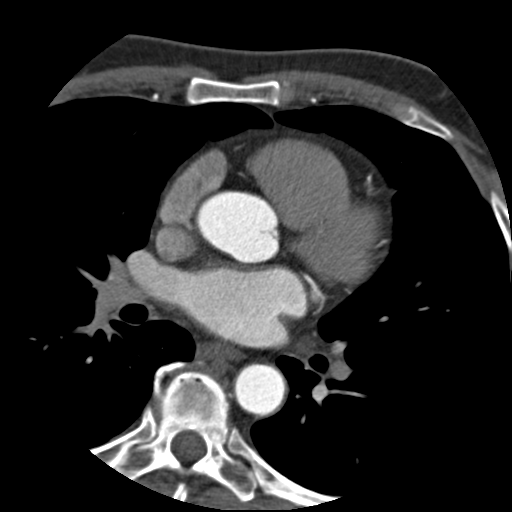
[im 36/46  vessel]
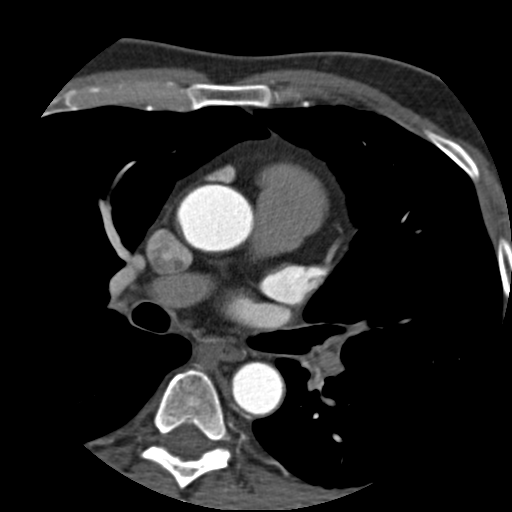
[im 41/46  vessel]
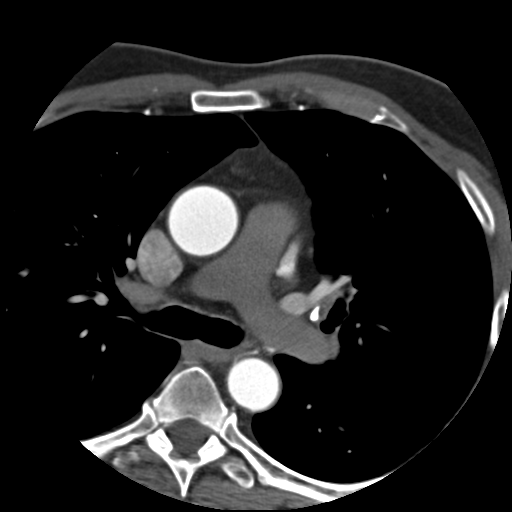

[16 of 20 positions shown; findings below may reference images not displayed]

FINDINGS: There are calcified bilateral hilar and mediastinal lymph
nodes compatible with old granulomatous disease.  Linear
atelectasis or scarring in the left lower lobe.  Aorta is normal
caliber.  Degenerative changes in the thoracic spine.

Imaging into the superior most part of the abdomen demonstrates
calcifications in the liver compatible with old granulomatous
disease.
IMPRESSION: Calcified mediastinal and hilar lymph nodes as well as visualized
liver compatible with old granulomatous disease.

Cardiac CT:
FINDINGS: Noncardiac: Calcified mediastinal and hilar lymph nodes as well as
visualized liver compatible with old granulomatous disease. Seem

Coronary Arteries:  Right dominant.  No anomalies.  The scan was
not optimized for coronary evaluation.  Calcium score was not
performed.  There was significant calcification in the proximal RCA
and LAD as well as the mid LAD and IM branch.  No flow limiting
disease in the  LM, RCA or circumflex or OM1.  There was 50% or
less calcific disease in the proximal/mid LAD and IM.

Morphology:  The LA was upper limits in size.  Measured 3.6 cm in
traditional echo 3 chamber view.  Measures 4.3 by 6.0 in 4 chamber
view.  The RA/RV and LV were normal.  EF was 62%.  There were no
RWMA;s.  There was no MIRIAM thrombus and the atrial septum appeared
intact.

Pulmonary Veins:  There were 4 normal appearing pulmonary veins
inserting into the back of the LA.  No stenosis. No accessory vein.

RUPV: 18.5 mm

RLPV: 17.0 mm

LUPV: 15.0 mm

LLPV: 18.4 mm

Measurement mad in the coronal plane

Aorta:  The ascending aorta was normal in size.  31mm AP and the
IMPRESSION: 1)    Upper limits of normal LA size 36mm

2)    No MIRIAM thrombus and intact atrial septum
3)    4 normal appearing pulmonary veins with no stenosis. RUPV:
18.5mm, RLPV: 17.0mm, LUPV: 15.0mm, LLPV: 18.4 mm
4)    Normal EF 62%
5)    Scan not optimized for coronary evaluation.  No significant
disease in LM, circumflex, or OM1.  Less than 30% calcified disease
in proximal RCA.  Less than 50% calcified disease in proximal and
mid LAD as well as IM.
6)    Normal ascending aortic root size 3.1 cm
7)    See separate report from [REDACTED] regarding
granulomatous lung disease

## 2012-09-08 ENCOUNTER — Encounter: Payer: Self-pay | Admitting: Internal Medicine

## 2012-11-17 ENCOUNTER — Ambulatory Visit (AMBULATORY_SURGERY_CENTER): Payer: Self-pay | Admitting: *Deleted

## 2012-11-17 VITALS — Ht 72.0 in | Wt 187.0 lb

## 2012-11-17 DIAGNOSIS — Z860100 Personal history of colon polyps, unspecified: Secondary | ICD-10-CM

## 2012-11-17 MED ORDER — MOVIPREP 100 G PO SOLR: ORAL | Status: DC

## 2012-11-17 NOTE — Progress Notes (Signed)
No allergies to eggs or soy. No problems with anesthesia.  

## 2012-11-20 ENCOUNTER — Encounter: Payer: Self-pay | Admitting: Internal Medicine

## 2012-12-01 ENCOUNTER — Encounter: Payer: Self-pay | Admitting: Internal Medicine

## 2012-12-01 ENCOUNTER — Ambulatory Visit (AMBULATORY_SURGERY_CENTER): Payer: Medicare HMO | Admitting: Internal Medicine

## 2012-12-01 VITALS — BP 126/74 | HR 52 | Temp 96.2°F | Resp 44 | Ht 72.0 in | Wt 187.0 lb

## 2012-12-01 DIAGNOSIS — D126 Benign neoplasm of colon, unspecified: Secondary | ICD-10-CM

## 2012-12-01 DIAGNOSIS — Z860100 Personal history of colon polyps, unspecified: Secondary | ICD-10-CM

## 2012-12-01 MED ORDER — SODIUM CHLORIDE 0.9 % IV SOLN: 500.0000 mL | INTRAVENOUS | Status: DC

## 2012-12-01 NOTE — Progress Notes (Signed)
Patient did not experience any of the following events: a burn prior to discharge; a fall within the facility; wrong site/side/patient/procedure/implant event; or a hospital transfer or hospital admission upon discharge from the facility. Patient did not have preoperative order for IV antibiotic SSI prophylaxis. [phone])

## 2012-12-01 NOTE — Progress Notes (Signed)
NO EGG OR SOY ALLERGY EWM NO PROBLEMS WITH PAST SEDATION. EWM 

## 2012-12-01 NOTE — Op Note (Signed)
[facility] [address] [place], [address]   COLONOSCOPY PROCEDURE REPORT  PATIENT: [name], [name]  MR#: [id number] BIRTHDATE: 09-25-1940 , 72  yrs. old GENDER: Male ENDOSCOPIST: Roxy Cedar, MD REFERRED OZ:[id number] Program Recall PROCEDURE DATE:  12/01/2012 PROCEDURE:   Colonoscopy with biopsy polypectomy x1 and Colonoscopy with snare polypectomy x 1 First Screening Colonoscopy - Avg.  risk and is 50 yrs.  old or older - No.  Prior Negative Screening - Now for repeat screening. N/A  History of Adenoma - Now for follow-up colonoscopy & has been > or = to 3 yrs.  No.  It has been less than 3 yrs since last colonoscopy.  Medical reason.  Polyps Removed Today? Yes. ASA CLASS:   Class III INDICATIONS:Patient's personal history of adenomatous colon polyps. Index exam January 2014. Seven adenomas including large sessile lesion removed piecemeal. Followup at this time to assess results in piecemeal resection MEDICATIONS: MAC sedation, administered by CRNA and propofol (Diprivan) 300mg  IV  DESCRIPTION OF PROCEDURE:   After the risks benefits and alternatives of the procedure were thoroughly explained, informed consent was obtained.  A digital rectal exam revealed no abnormalities of the rectum.   The LB [id number]  endoscope was introduced through the anus and advanced to the cecum, which was identified by both the appendix and ileocecal valve. No adverse events experienced.   The quality of the prep was excellent, using MoviPrep  The instrument was then slowly withdrawn as the colon was fully examined.  COLON FINDINGS: A diminutive polyp was found in the proximal transverse colon and removed with cold forceps.  The resection was complete and tissue completely retrieved.   A sessile polyp,71mm in size, was found in the distal transverse colon with a cold snare. The resection was complete and the polyp tissue was completely retrieved. Prior area of  these no polypectomy was clean. Marking tattoo identified.   The colon mucosa was otherwise normal. Retroflexed views revealed internal hemorrhoids. The time to cecum=3 minutes 09 seconds.  Withdrawal time=15 minutes 02 seconds. The scope was withdrawn and the procedure completed. COMPLICATIONS: There were no complications.  ENDOSCOPIC IMPRESSION: 1.   Diminutive polyp was found in the proximal transverse colon; polypectomy was performed with cold forceps 2.   Sessile polyp ,7mm in size, was found in the distal transverse colon; polypectomy was performed with a cold snare 3.   The colon mucosa was otherwise normal  RECOMMENDATIONS: 1. Repeat Colonoscopy in 2 years from index exam (around January 2017).   eSigned:  Roxy Cedar, MD 12/01/2012 10:02 AM   cc: Jarome Matin, MD and The Patient   PATIENT NAME:  John Sheppard, John Sheppard MR#: [id number]

## 2012-12-01 NOTE — Progress Notes (Signed)
Called to room to assist during endoscopic procedure.  Patient ID and intended procedure confirmed with present staff. Received instructions for my participation in the procedure from the performing physician.  

## 2012-12-01 NOTE — Progress Notes (Signed)
A/ox3 pleased with MAC, report to Jane RN 

## 2012-12-01 NOTE — Patient Instructions (Signed)

## 2012-12-02 ENCOUNTER — Telehealth: Payer: Self-pay

## 2012-12-02 NOTE — Telephone Encounter (Signed)
  Follow up Call-  Call back number 12/01/2012 02/20/2012  Post procedure Call Back phone  # [phone]  Permission to leave phone message Yes Yes     Patient questions:  Do you have a fever, pain , or abdominal swelling? no Pain Score  0 *  Have you tolerated food without any problems? yes  Have you been able to return to your normal activities? yes  Do you have any questions about your discharge instructions: Diet   no Medications  no Follow up visit  no  Do you have questions or concerns about your Care? no  Actions: * If pain score is 4 or above: No action needed, pain <4.

## 2012-12-07 ENCOUNTER — Encounter: Payer: Self-pay | Admitting: Internal Medicine

## 2013-04-21 ENCOUNTER — Other Ambulatory Visit: Payer: Self-pay | Admitting: Internal Medicine

## 2013-06-22 ENCOUNTER — Other Ambulatory Visit: Payer: Self-pay | Admitting: Internal Medicine

## 2013-11-28 ENCOUNTER — Telehealth: Payer: Self-pay | Admitting: Internal Medicine

## 2013-11-28 NOTE — Telephone Encounter (Signed)
Spoke with patient in follow-up by phone.  He has had no afib post ablation and is very pleased with his results.  His post termination pauses/ presyncope are completely gone.  He is playing golf and enjoying life.  He would like for Melissa to call and arrange follow-up (next few months).

## 2013-12-16 ENCOUNTER — Other Ambulatory Visit: Payer: Self-pay | Admitting: Internal Medicine

## 2014-03-03 ENCOUNTER — Other Ambulatory Visit: Payer: Self-pay | Admitting: Internal Medicine

## 2014-03-07 ENCOUNTER — Ambulatory Visit (INDEPENDENT_AMBULATORY_CARE_PROVIDER_SITE_OTHER): Payer: Commercial Managed Care - HMO | Admitting: Internal Medicine

## 2014-03-07 ENCOUNTER — Encounter: Payer: Self-pay | Admitting: Internal Medicine

## 2014-03-07 VITALS — BP 112/70 | HR 49 | Ht 73.0 in | Wt 191.4 lb

## 2014-03-07 DIAGNOSIS — I480 Paroxysmal atrial fibrillation: Secondary | ICD-10-CM | POA: Diagnosis not present

## 2014-03-07 DIAGNOSIS — R001 Bradycardia, unspecified: Secondary | ICD-10-CM | POA: Diagnosis not present

## 2014-03-07 NOTE — Patient Instructions (Signed)
Your physician wants you to follow-up in: 12 months Dr. Vallery Ridge will receive a reminder letter in the mail two months in advance. If you don't receive a letter, please call our office to schedule the follow-up appointment.  Your physician recommends that you continue on your current medications as directed. Please refer to the Current Medication list given to you today.

## 2014-03-07 NOTE — Progress Notes (Signed)
PCP: [name], MD Primary Cardiologist:  Dr Martinique Primary EP: Laurabelle Gorczyca  John Sheppard is a 74 y.o. male who presents today for routine electrophysiology followup.  Since last being seen in our clinic, the patient reports doing very well.  Today, he denies symptoms of palpitations, chest pain, shortness of breath,  lower extremity edema, dizziness, presyncope, or syncope.  The patient is otherwise without complaint today.   Past Medical History  Diagnosis Date  . Paroxysmal atrial fibrillation     s/p PVI 10/20/09, 10/19/10  . Atrial flutter     s/p CTI ablation 10/20/09  . Macular degeneration   . Hyperthyroidism   . Allergy    Past Surgical History  Procedure Laterality Date  . Cardioversion      2001  . Transesophageal echocardiogram      9/11  EF: 60-65%  . Atrial ablation surgery      PVI and CTI ablation by JA 10/20/09 and 10/19/10  . Cataract extraction      july, 2012  . Colonoscopy    . Polypectomy      ROS- all systems are reviewed and negatives except as per HPI above  Current Outpatient Prescriptions  Medication Sig Dispense Refill  . aspirin EC 81 MG tablet Take 1 tablet (81 mg total) by mouth daily. 150 tablet 2  . ferrous sulfate 325 (65 FE) MG tablet Take 325 mg by mouth daily with breakfast.    . Multiple Vitamin (MULTIVITAMIN) tablet Take 1 tablet by mouth daily.      . Multiple Vitamins-Minerals (PRESERVISION/LUTEIN PO) Take 1 capsule by mouth daily.     . Omega-3 Fatty Acids (FISH OIL PO) Take 1 capsule by mouth daily.     . pravastatin (PRAVACHOL) 40 MG tablet Take 1 tablet by mouth daily.    . sotalol (BETAPACE) 160 MG tablet TAKE 1 TABLET TWICE DAILY (Patient taking differently: TAKE 1 TABLET BY MOUTH TWICE DAILY) 180 tablet 0  . [DISCONTINUED] warfarin (COUMADIN) 7.5 MG tablet Take 4 mg by mouth as directed.      No current facility-administered medications for this visit.    Physical Exam: Filed Vitals:   03/07/14 1420  BP: 112/70  Pulse: 49   Height: 6\' 1"  (1.854 m)  Weight: 191 lb 6.4 oz (86.818 kg)    GEN- The patient is well appearing, alert and oriented x 3 today.   Head- normocephalic, atraumatic Eyes-  Sclera clear, conjunctiva pink Ears- hearing intact Oropharynx- clear Lungs- Clear to ausculation bilaterally, normal work of breathing Heart- Regular rate and rhythm, no murmurs, rubs or gallops, PMI not laterally displaced GI- soft, NT, ND, + BS Extremities- no clubbing, cyanosis, or edema  ekg today reveals sinus bradycadria 49 bpm, otherwise normal ekg  Assessment and Plan:  1. afib Well controlled s/p ablation He is reluctant to stop sotalol but could almost certainly do so He is asymptomatic with bradycardia No changes today chad2vasc is 1. He is on ASA  2. HL Stable No change required today  He wishes for Dr Philip Aspen to follow BMET, Mg twice per year  Return to see me in 1 year

## 2014-03-14 DIAGNOSIS — Z961 Presence of intraocular lens: Secondary | ICD-10-CM | POA: Diagnosis not present

## 2014-03-14 DIAGNOSIS — H3531 Nonexudative age-related macular degeneration: Secondary | ICD-10-CM | POA: Diagnosis not present

## 2014-03-14 DIAGNOSIS — H26492 Other secondary cataract, left eye: Secondary | ICD-10-CM | POA: Diagnosis not present

## 2014-03-31 DIAGNOSIS — H26492 Other secondary cataract, left eye: Secondary | ICD-10-CM | POA: Diagnosis not present

## 2014-03-31 DIAGNOSIS — H2640 Unspecified secondary cataract: Secondary | ICD-10-CM | POA: Diagnosis not present

## 2014-06-18 ENCOUNTER — Other Ambulatory Visit: Payer: Self-pay | Admitting: Internal Medicine

## 2014-11-01 ENCOUNTER — Encounter: Payer: Self-pay | Admitting: Internal Medicine

## 2014-11-28 DIAGNOSIS — Z79899 Other long term (current) drug therapy: Secondary | ICD-10-CM | POA: Diagnosis not present

## 2014-11-28 DIAGNOSIS — Z8679 Personal history of other diseases of the circulatory system: Secondary | ICD-10-CM | POA: Diagnosis not present

## 2014-11-28 DIAGNOSIS — R42 Dizziness and giddiness: Secondary | ICD-10-CM | POA: Diagnosis not present

## 2014-11-28 DIAGNOSIS — Z23 Encounter for immunization: Secondary | ICD-10-CM | POA: Diagnosis not present

## 2014-11-28 DIAGNOSIS — J309 Allergic rhinitis, unspecified: Secondary | ICD-10-CM | POA: Diagnosis not present

## 2014-11-28 DIAGNOSIS — R5383 Other fatigue: Secondary | ICD-10-CM | POA: Diagnosis not present

## 2014-11-29 DIAGNOSIS — R42 Dizziness and giddiness: Secondary | ICD-10-CM | POA: Diagnosis not present

## 2014-12-19 ENCOUNTER — Other Ambulatory Visit: Payer: Self-pay | Admitting: Internal Medicine

## 2015-01-25 DIAGNOSIS — E784 Other hyperlipidemia: Secondary | ICD-10-CM | POA: Diagnosis not present

## 2015-01-25 DIAGNOSIS — Z7901 Long term (current) use of anticoagulants: Secondary | ICD-10-CM | POA: Diagnosis not present

## 2015-01-25 DIAGNOSIS — E038 Other specified hypothyroidism: Secondary | ICD-10-CM | POA: Diagnosis not present

## 2015-01-25 DIAGNOSIS — Z125 Encounter for screening for malignant neoplasm of prostate: Secondary | ICD-10-CM | POA: Diagnosis not present

## 2015-03-20 DIAGNOSIS — D126 Benign neoplasm of colon, unspecified: Secondary | ICD-10-CM | POA: Diagnosis not present

## 2015-03-20 DIAGNOSIS — I480 Paroxysmal atrial fibrillation: Secondary | ICD-10-CM | POA: Diagnosis not present

## 2015-03-20 DIAGNOSIS — M545 Low back pain: Secondary | ICD-10-CM | POA: Diagnosis not present

## 2015-03-20 DIAGNOSIS — Z0000 Encounter for general adult medical examination without abnormal findings: Secondary | ICD-10-CM | POA: Diagnosis not present

## 2015-03-20 DIAGNOSIS — E784 Other hyperlipidemia: Secondary | ICD-10-CM | POA: Diagnosis not present

## 2015-03-20 DIAGNOSIS — Z6826 Body mass index (BMI) 26.0-26.9, adult: Secondary | ICD-10-CM | POA: Diagnosis not present

## 2015-03-20 DIAGNOSIS — I10 Essential (primary) hypertension: Secondary | ICD-10-CM | POA: Diagnosis not present

## 2015-03-20 DIAGNOSIS — Z1389 Encounter for screening for other disorder: Secondary | ICD-10-CM | POA: Diagnosis not present

## 2015-03-23 ENCOUNTER — Other Ambulatory Visit: Payer: Self-pay | Admitting: Internal Medicine

## 2015-03-30 DIAGNOSIS — Z6826 Body mass index (BMI) 26.0-26.9, adult: Secondary | ICD-10-CM | POA: Diagnosis not present

## 2015-03-30 DIAGNOSIS — M545 Low back pain: Secondary | ICD-10-CM | POA: Diagnosis not present

## 2015-03-30 DIAGNOSIS — M5416 Radiculopathy, lumbar region: Secondary | ICD-10-CM | POA: Diagnosis not present

## 2015-04-03 ENCOUNTER — Other Ambulatory Visit: Payer: Self-pay | Admitting: Internal Medicine

## 2015-04-03 DIAGNOSIS — M5416 Radiculopathy, lumbar region: Secondary | ICD-10-CM

## 2015-04-07 ENCOUNTER — Ambulatory Visit
Admission: RE | Admit: 2015-04-07 | Discharge: 2015-04-07 | Disposition: A | Discharge status: Home / Self Care | Payer: Commercial Managed Care - HMO | Source: Ambulatory Visit | Attending: Internal Medicine | Admitting: Internal Medicine

## 2015-04-07 DIAGNOSIS — M5416 Radiculopathy, lumbar region: Secondary | ICD-10-CM

## 2015-04-07 DIAGNOSIS — M5126 Other intervertebral disc displacement, lumbar region: Secondary | ICD-10-CM | POA: Diagnosis not present

## 2015-04-08 ENCOUNTER — Inpatient Hospital Stay: Admission: RE | Admit: 2015-04-08 | Payer: Commercial Managed Care - HMO | Source: Ambulatory Visit

## 2015-04-11 ENCOUNTER — Other Ambulatory Visit: Payer: Commercial Managed Care - HMO

## 2015-05-02 DIAGNOSIS — M5116 Intervertebral disc disorders with radiculopathy, lumbar region: Secondary | ICD-10-CM | POA: Diagnosis not present

## 2015-05-02 DIAGNOSIS — M4316 Spondylolisthesis, lumbar region: Secondary | ICD-10-CM | POA: Diagnosis not present

## 2015-05-02 DIAGNOSIS — M5416 Radiculopathy, lumbar region: Secondary | ICD-10-CM | POA: Diagnosis not present

## 2015-05-08 ENCOUNTER — Encounter: Payer: Self-pay | Admitting: Internal Medicine

## 2015-05-08 ENCOUNTER — Ambulatory Visit (INDEPENDENT_AMBULATORY_CARE_PROVIDER_SITE_OTHER): Payer: Commercial Managed Care - HMO | Admitting: Internal Medicine

## 2015-05-08 VITALS — BP 134/88 | HR 59 | Ht 73.0 in | Wt 189.2 lb

## 2015-05-08 DIAGNOSIS — I10 Essential (primary) hypertension: Secondary | ICD-10-CM | POA: Diagnosis not present

## 2015-05-08 DIAGNOSIS — R413 Other amnesia: Secondary | ICD-10-CM

## 2015-05-08 DIAGNOSIS — I480 Paroxysmal atrial fibrillation: Secondary | ICD-10-CM | POA: Diagnosis not present

## 2015-05-08 DIAGNOSIS — H539 Unspecified visual disturbance: Secondary | ICD-10-CM | POA: Insufficient documentation

## 2015-05-08 MED ORDER — LISINOPRIL 5 MG PO TABS: 5.0000 mg | ORAL_TABLET | Freq: Every day | ORAL | Status: DC

## 2015-05-08 NOTE — Patient Instructions (Signed)
Medication Instructions:  Your physician has recommended you make the following change in your medication:  1) Start lisinopril 5 mg daily   Labwork: None ordered   Testing/Procedures: None ordered   Follow-Up: You have been referred to [facility](Dr Sethi/Dr Ahern)---TIA's vs migraines   Your physician wants you to follow-up in: 12 months with Dr Vallery Ridge will receive a reminder letter in the mail two months in advance. If you don't receive a letter, please call our office to schedule the follow-up appointment.   Any Other Special Instructions Will Be Listed Below (If Applicable).     If you need a refill on your cardiac medications before your next appointment, please call your pharmacy.

## 2015-05-08 NOTE — Progress Notes (Signed)
PCP: [name], MD Primary Cardiologist:  Dr Martinique Primary EP: Roanna Epley Kywan Teutsch. is a 75 y.o. male who presents today for routine electrophysiology followup.  Since last being seen in our clinic, the patient reports doing well.  He has difficulty with back and leg pain which has limited his golf.  He is unaware of any afib.  Today, he denies symptoms of palpitations, chest pain, shortness of breath,  lower extremity edema, dizziness, presyncope, or syncope.  He has had 2 episodes over the past 4 months where he had transient blurred vision with difficulty in remembering peoples names.  These episodes last 10 minuts and then resolve.  He is unaware of triggers for the events. The patient is otherwise without complaint today.   Past Medical History  Diagnosis Date  . Paroxysmal atrial fibrillation ([place])     s/p PVI 10/20/09, 10/19/10  . Atrial flutter ([place])     s/p CTI ablation 10/20/09  . Macular degeneration   . Hyperthyroidism   . Allergy    Past Surgical History  Procedure Laterality Date  . Cardioversion      2001  . Transesophageal echocardiogram      9/11  EF: 60-65%  . Atrial ablation surgery      PVI and CTI ablation by JA 10/20/09 and 10/19/10  . Cataract extraction      july, 2012  . Colonoscopy    . Polypectomy      ROS- all systems are reviewed and negatives except as per HPI above  Current Outpatient Prescriptions  Medication Sig Dispense Refill  . aspirin EC 81 MG tablet Take 1 tablet (81 mg total) by mouth daily. 150 tablet 2  . ferrous sulfate 325 (65 FE) MG tablet Take 325 mg by mouth daily with breakfast.    . Multiple Vitamin (MULTIVITAMIN) tablet Take 1 tablet by mouth daily.      . Multiple Vitamins-Minerals (PRESERVISION/LUTEIN PO) Take 1 capsule by mouth daily.     . Omega-3 Fatty Acids (FISH OIL PO) Take 1 capsule by mouth daily.     . pravastatin (PRAVACHOL) 40 MG tablet Take 1 tablet by mouth daily.    . sotalol (BETAPACE) 160 MG tablet TAKE 1  TABLET (160 MG TOTAL) BY MOUTH 2 (TWO) TIMES DAILY.  (APPOINTMENT NEEDED) 180 tablet 0  . lisinopril (PRINIVIL,ZESTRIL) 5 MG tablet Take 1 tablet (5 mg total) by mouth daily. 90 tablet 3  . [DISCONTINUED] warfarin (COUMADIN) 7.5 MG tablet Take 4 mg by mouth as directed.      No current facility-administered medications for this visit.    Physical Exam: Filed Vitals:   05/08/15 1525  BP: 134/88  Pulse: 59  Height: 6\' 1"  (1.854 m)  Weight: 189 lb 4 oz (85.843 kg)    GEN- The patient is well appearing, alert and oriented x 3 today.   Head- normocephalic, atraumatic Eyes-  Sclera clear, conjunctiva pink Ears- hearing intact Oropharynx- clear Lungs- Clear to ausculation bilaterally, normal work of breathing Heart- Regular rate and rhythm, no murmurs, rubs or gallops, PMI not laterally displaced GI- soft, NT, ND, + BS Extremities- no clubbing, cyanosis, or edema  ekg today reveals sinus rhythm 59 bpm, otherwise normal ekg  Assessment and Plan:  1. afib Well controlled s/p ablation He is reluctant to stop sotalol but could almost certainly do so He is asymptomatic with bradycardia No changes today chad2vasc is 1. He is on ASA  2. HL Stable No change required today  3. Blurred vision, difficulty with name finding He has had 2 episodes over the past 4 months where he had transient blurred vision with difficulty in remembering peoples names.  These episodes last 10 minuts and then resolve.  He is unaware of triggers for the events.  I worry that this could be due to TIA.  DDx would also include migrane and other possibilities.  I will refer to Neurology.  If this is felt to be TIA, then his chads2vasc score would increase and I would favor initiation of eliquis at that time.  I have therefore placed a referral to neurology today.  4. HTN Well controlled with lisinopril 5mg  prn  He wishes for Dr Philip Aspen to follow BMET, Mg twice per year  Return to see me in 1 year  Thompson Grayer MD, [facility] 05/08/2015 3:27 PM

## 2015-05-12 ENCOUNTER — Ambulatory Visit (INDEPENDENT_AMBULATORY_CARE_PROVIDER_SITE_OTHER): Payer: Commercial Managed Care - HMO | Admitting: Neurology

## 2015-05-12 ENCOUNTER — Encounter: Payer: Self-pay | Admitting: Neurology

## 2015-05-12 VITALS — BP 141/84 | HR 63 | Ht 72.0 in | Wt 188.2 lb

## 2015-05-12 DIAGNOSIS — G458 Other transient cerebral ischemic attacks and related syndromes: Secondary | ICD-10-CM | POA: Diagnosis not present

## 2015-05-12 DIAGNOSIS — H539 Unspecified visual disturbance: Secondary | ICD-10-CM

## 2015-05-12 MED ORDER — CLOPIDOGREL BISULFATE 75 MG PO TABS: 75.0000 mg | ORAL_TABLET | Freq: Every day | ORAL | Status: DC

## 2015-05-12 NOTE — Patient Instructions (Signed)
Remember to drink plenty of fluid, eat healthy meals and do not skip any meals. Try to eat protein with a every meal and eat a healthy snack such as fruit or nuts in between meals. Try to keep a regular sleep-wake schedule and try to exercise daily, particularly in the form of walking, 20-30 minutes a day, if you can.   As far as your medications are concerned, I would like to suggest: Stop ASA. Start Plavix.  As far as diagnostic testing: MRI of the brain, MRA of the head, Carotid Dopplers  I would like to see you back in 6 months, sooner if we need to. Please call us with any interim questions, concerns, problems, updates or refill requests.   Our phone number is [phone]. We also have an after hours call service for urgent matters and there is a physician on-call for urgent questions. For any emergencies you know to call 911 or go to the nearest emergency room

## 2015-05-12 NOTE — Progress Notes (Signed)
[facility]    Provider:  Dr Jaynee Eagles Referring Provider: [name], MD Primary Care Physician:  [name], MD  CC:  Transient monocular vision changes  HPI:  John Conkin. is a 75 y.o. male here as a referral from Dr. Philip Aspen and [name] for vision changes. Past medical history of atrial fibrillation, atrial flutter, status post ablation and cardioversion, macular degeneration, hypothyroidism. He has had 2 ablations and does not have afib anymore. He has had 4 episodes of transient vision changes over the last 4-5 years with vision changes in the right eye, gets blurry, he has trouble remembering names in the room, goes on for 4-10 minutes. No known triggers. Two of these have occurred in the last 4-6 weeks. Last episode was a week ago. When symptoms occur, he can follow the conversation. Last happened a week ago. He has trouble reading during the episodes because of his vision loss. He also has macular degeneration of the right eye. He is scheduled for an ESI on Monday for LBP. No other associated symptoms with the blurred vision(no headache) except for slight confusion. When it would happen he would say his ABCs, he could say the ABCs and there was no loss of awareness just blurry periphery in the right eye only. He goes to Dr. Nolon Sheppard for ophthalmology and he has not mentioned it to her yet. No other associated symptoms with episodes, not lightheaded, not dizzy, no SOB, no CP, not diaphoretic, no headaches. Episodes last 4-5 minutes. Blurriness with lines in it. No history of migraines. No associated headache. No associated numbness or tingling. He has been in a room full of people and no one notices anything different. No other focal neurologic symptoms. He is currently just on aspirin, he discontinued warfarin after his ablation.  Reviewed notes, labs and imaging from outside physicians, which showed: Patient follows with [name]. He denies  palpitations, chest pain, shortness of breath, lower extremity edema, dizziness, presyncope or syncope. He does have previous history of atrial fibrillation and atrial flutter status post ablation and cardioversion. His A. fib is well controlled status post ablation. He is on sotalol. He wanted to. He is asymptomatic bradycardia. Chad2vasc score is 1. He is on aspirin.  Review of Systems: Patient complains of symptoms per HPI as well as the following symptoms: Aching muscles and left femoral nerve. Pertinent negatives per HPI. All others negative.   Social History   Social History  . Marital Status: Married    Spouse Name: John Sheppard  . Number of Children: 2  . Years of Education: 16   Occupational History  . Retired    Social History Main Topics  . Smoking status: Never Smoker   . Smokeless tobacco: Never Used  . Alcohol Use: 1.2 oz/week    2 Cans of beer per week  . Drug Use: No  . Sexual Activity: Not on file   Other Topics Concern  . Not on file   Social History Narrative   Lives w/ wife   Caffeine use:  Drinks tea/diet pepsi daily    Family History  Problem Relation Age of Onset  . Heart failure Father     DECEASED  . Colon cancer Neg Hx   . Stomach cancer Neg Hx     Past Medical History  Diagnosis Date  . Paroxysmal atrial fibrillation ([place])     s/p PVI 10/20/09, 10/19/10  . Atrial flutter ([place])     s/p CTI ablation 10/20/09  .  Macular degeneration   . Hyperthyroidism   . Allergy     Past Surgical History  Procedure Laterality Date  . Cardioversion      2001  . Transesophageal echocardiogram      9/11  EF: 60-65%  . Atrial ablation surgery      PVI and CTI ablation by JA 10/20/09 and 10/19/10  . Cataract extraction      july, 2012  . Colonoscopy    . Polypectomy      Current Outpatient Prescriptions  Medication Sig Dispense Refill  . ferrous sulfate 325 (65 FE) MG tablet Take 325 mg by mouth daily with breakfast.    . Multiple Vitamin (MULTIVITAMIN)  tablet Take 1 tablet by mouth daily.      . Multiple Vitamins-Minerals (PRESERVISION/LUTEIN PO) Take 1 capsule by mouth daily.     . Omega-3 Fatty Acids (FISH OIL PO) Take 1 capsule by mouth daily.     . pravastatin (PRAVACHOL) 40 MG tablet Take 1 tablet by mouth daily.    . sotalol (BETAPACE) 160 MG tablet TAKE 1 TABLET (160 MG TOTAL) BY MOUTH 2 (TWO) TIMES DAILY.  (APPOINTMENT NEEDED) 180 tablet 0  . lisinopril (PRINIVIL,ZESTRIL) 5 MG tablet Take 1 tablet (5 mg total) by mouth daily. (Patient not taking: Reported on 05/12/2015) 90 tablet 3  . [DISCONTINUED] warfarin (COUMADIN) 7.5 MG tablet Take 4 mg by mouth as directed.      No current facility-administered medications for this visit.    Allergies as of 05/12/2015  . (No Known Allergies)    Vitals: BP 141/84 mmHg  Pulse 63  Ht 6' (1.829 m)  Wt 188 lb 3.2 oz (85.367 kg)  BMI 25.52 kg/m2 Last Weight:  Wt Readings from Last 1 Encounters:  05/12/15 188 lb 3.2 oz (85.367 kg)   Last Height:   Ht Readings from Last 1 Encounters:  05/12/15 6' (1.829 m)    Physical exam: Exam: Gen: NAD, conversant, well nourised, well groomed                     CV: RRR, no MRG. No Carotid Bruits. No peripheral edema, warm, nontender Eyes: Conjunctivae clear without exudates or hemorrhage  Neuro: Detailed Neurologic Exam  Speech:    Speech is normal; fluent and spontaneous with normal comprehension.  Cognition:    The patient is oriented to person, place, and time;     recent and remote memory intact;     language fluent;     normal attention, concentration,     fund of knowledge Cranial Nerves:    The pupils are equal, round, and reactive to light. The fundi are normal and spontaneous venous pulsations are present. Visual fields are full to finger confrontation. Extraocular movements are intact. Trigeminal sensation is intact and the muscles of mastication are normal. The face is symmetric. The palate elevates in the midline. Hearing  intact. Voice is normal. Shoulder shrug is normal. The tongue has normal motion without fasciculations.   Coordination:    Normal finger to nose and heel to shin. Normal rapid alternating movements.   Gait:    Heel-toe and tandem gait are normal.   Motor Observation:    No asymmetry, no atrophy, and no involuntary movements noted. Tone:    Normal muscle tone.    Posture:    Posture is normal. normal erect    Strength: Left hip flexion weakness otherwise strength is V/V in the upper and lower limbs.  Sensation: intact to LT     Reflex Exam:  DTR's: Absent left patellar. Otherwise deep tendon reflexes in the upper and lower extremities are normal bilaterally.   Toes:    The toes are downgoing bilaterally.   Clonus:    Clonus is absent.      Assessment/Plan:  [age] year old male here with transient monocular vision changes.Transient vision loss may indicate underlying vascular disease, including carotid occlusion and thromboembolism, or it may have a more benign etiology, such as migraine or vasospasm. This would be an unusual presentation for migraines given patient's age and no previous history of headaches or migraines. He needs to follow up with ophthalmology as well for examination.  We'll perform a thorough stroke evaluation. MRA of the head and MRI of the brain (schedule 05/24/2015) Change aspirin to Plavix temporarily. Discussed increased bleeding risk. After workup if no other cause is found (carotid stenosis for example) will discuss with [name] If Eliquis or other DOAC is more appropriate due to possibly increased Chad2vasc score of +3 with TIA.  Carotid Dopplers ordered Continue statin for stroke prevention. Will discuss with Dr. Erlinda Hong or Leonie Man, vascular/stroke neurologists when imaging completed.  I had a long d/w patient about his recent possible TIA, risk for stroke/recurrent TIAs. Continue Plavix for secondary stroke prevention and maintain strict control  of hypertension with blood pressure goal below 130/90, diabetes with hemoglobin A1c goal below 6.5% and lipids with LDL cholesterol goal below 70 mg/dL. I also advised the patient to eat a healthy diet with plenty of whole grains, cereals, fruits and vegetables, exercise regularly and maintain ideal body weight .Followup in the future with me in 6 months or call earlier if necessary.  Sarina Ill, MD  [facility] [address] [place], [place] [address]  Phone [phone] Fax [phone]

## 2015-05-15 DIAGNOSIS — M4316 Spondylolisthesis, lumbar region: Secondary | ICD-10-CM | POA: Diagnosis not present

## 2015-05-15 DIAGNOSIS — M5116 Intervertebral disc disorders with radiculopathy, lumbar region: Secondary | ICD-10-CM | POA: Diagnosis not present

## 2015-05-15 DIAGNOSIS — M5416 Radiculopathy, lumbar region: Secondary | ICD-10-CM | POA: Diagnosis not present

## 2015-05-21 ENCOUNTER — Other Ambulatory Visit: Payer: Commercial Managed Care - HMO

## 2015-05-23 ENCOUNTER — Telehealth: Payer: Self-pay | Admitting: *Deleted

## 2015-05-23 ENCOUNTER — Ambulatory Visit (HOSPITAL_COMMUNITY)
Admission: RE | Admit: 2015-05-23 | Discharge: 2015-05-23 | Disposition: A | Discharge status: Home / Self Care | Payer: Commercial Managed Care - HMO | Source: Ambulatory Visit | Attending: Neurology | Admitting: Neurology

## 2015-05-23 DIAGNOSIS — H539 Unspecified visual disturbance: Secondary | ICD-10-CM | POA: Diagnosis not present

## 2015-05-23 DIAGNOSIS — G458 Other transient cerebral ischemic attacks and related syndromes: Secondary | ICD-10-CM | POA: Diagnosis not present

## 2015-05-23 NOTE — Progress Notes (Signed)
*  PRELIMINARY RESULTS* Vascular Ultrasound Carotid Duplex (Doppler) has been completed.  Preliminary findings: Bilateral: No significant (1-39%) ICA stenosis. Antegrade vertebral flow.    Landry Mellow, RDMS, RVT  05/23/2015, 10:29 AM

## 2015-05-23 NOTE — Telephone Encounter (Signed)
-----   Message from Melvenia Beam, MD sent at 05/23/2015  5:23 PM EDT ----- No hemodynamically significant carotid artery stenosis thanks.

## 2015-05-23 NOTE — Telephone Encounter (Signed)
Called and spoke to pt about results per Dr Jaynee Eagles note. He verbalized understanding.

## 2015-05-24 ENCOUNTER — Ambulatory Visit
Admission: RE | Admit: 2015-05-24 | Discharge: 2015-05-24 | Disposition: A | Discharge status: Home / Self Care | Payer: Commercial Managed Care - HMO | Source: Ambulatory Visit | Attending: Neurology | Admitting: Neurology

## 2015-05-24 DIAGNOSIS — G459 Transient cerebral ischemic attack, unspecified: Secondary | ICD-10-CM | POA: Diagnosis not present

## 2015-05-24 DIAGNOSIS — H539 Unspecified visual disturbance: Secondary | ICD-10-CM

## 2015-05-24 DIAGNOSIS — G458 Other transient cerebral ischemic attacks and related syndromes: Secondary | ICD-10-CM

## 2015-05-31 ENCOUNTER — Telehealth: Payer: Self-pay | Admitting: Neurology

## 2015-05-31 NOTE — Telephone Encounter (Signed)
Called patient and discussed negative workup(below). It is very unclear why he is having his episodes of repeated right eye vision changes and confusion. Almost sounds migrainous or migraine equivalent however patient has no history of headaches or migraines. Not classic presentation for embolic TIA. I discussed the results with patient and gave him several options: to stay on Plavix or possibly start Eliquis with the pros and cons discussed. Also discussed possible 30-day holter moniotr or loop recorder. Patient would like to stay on Plavix at this time and discuss further with Dr. Joylene Grapes. I will include Dr. Joylene Grapes on this telephone encounter. Discussed with Dr. Erlinda Hong, vascular and stroke neurologist, who agrees with conclusions above. Thanks.  MRA head:  - No focal stenosis or occlusions noted.  - Normal variant branch pattern of intracranial circulation. Specifically, the posterior circulation is hypoplastic and the bilateral posterior cerebral arteries arise from the anterior circulation. Also, the bilateral anterior cerebral arteries arise primarily from the left sided circulation.   MR brain :  Abnormal MRI brain (without) demonstrating: 1. Mild scattered periventricular, juxtacortical and subcortical chronic small vessel ischemic disease. 2. No acute findings.  Carotid dopplers:   Summary:  - The vertebral arteries appear patent with antegrade flow. - Findings consistent with 1- 39 percent stenosis involving the  right internal carotid artery and the left internal carotid  artery.

## 2015-06-02 NOTE — Telephone Encounter (Signed)
Thanks so much  My preference would be to stop plavix and start eliquis as you have advised.

## 2015-06-03 NOTE — Telephone Encounter (Signed)
John Sheppard, since I am at the [facility] conference in [place] this week, would you call patient and let him know that I discussed everything with  Dr Marlou Porch and Dr. Jackalyn Lombard preference is to start Eliquis which is the blood thinner we discussed at length on the phone. Again, I want to reassure [name] that Eliquis is nothing like Warfarin (patient was on Warfarin for years and understandable has reservations). I do agree that he should highly consider Eliquis. He was going to follow up with [name] and wanted to discuss with him as well.  Thanks.

## 2015-06-05 NOTE — Telephone Encounter (Signed)
Called and spoke to pt. Relayed Dr Jaynee Eagles message below. He is going to contact Dr Marlou Porch and discuss as well. He verbalized understanding.

## 2015-06-30 ENCOUNTER — Other Ambulatory Visit: Payer: Self-pay | Admitting: Internal Medicine

## 2015-08-11 DIAGNOSIS — Z961 Presence of intraocular lens: Secondary | ICD-10-CM | POA: Diagnosis not present

## 2015-08-11 DIAGNOSIS — H43813 Vitreous degeneration, bilateral: Secondary | ICD-10-CM | POA: Diagnosis not present

## 2015-08-11 DIAGNOSIS — H353131 Nonexudative age-related macular degeneration, bilateral, early dry stage: Secondary | ICD-10-CM | POA: Diagnosis not present

## 2015-08-11 DIAGNOSIS — H35371 Puckering of macula, right eye: Secondary | ICD-10-CM | POA: Diagnosis not present

## 2016-03-18 DIAGNOSIS — Z85850 Personal history of malignant neoplasm of thyroid: Secondary | ICD-10-CM | POA: Diagnosis not present

## 2016-03-18 DIAGNOSIS — E784 Other hyperlipidemia: Secondary | ICD-10-CM | POA: Diagnosis not present

## 2016-03-18 DIAGNOSIS — Z125 Encounter for screening for malignant neoplasm of prostate: Secondary | ICD-10-CM | POA: Diagnosis not present

## 2016-03-18 DIAGNOSIS — R8299 Other abnormal findings in urine: Secondary | ICD-10-CM | POA: Diagnosis not present

## 2016-03-18 DIAGNOSIS — I10 Essential (primary) hypertension: Secondary | ICD-10-CM | POA: Diagnosis not present

## 2016-03-20 ENCOUNTER — Other Ambulatory Visit: Payer: Self-pay | Admitting: Internal Medicine

## 2016-03-25 DIAGNOSIS — Z1389 Encounter for screening for other disorder: Secondary | ICD-10-CM | POA: Diagnosis not present

## 2016-03-25 DIAGNOSIS — I10 Essential (primary) hypertension: Secondary | ICD-10-CM | POA: Diagnosis not present

## 2016-03-25 DIAGNOSIS — M545 Low back pain: Secondary | ICD-10-CM | POA: Diagnosis not present

## 2016-03-25 DIAGNOSIS — Z6826 Body mass index (BMI) 26.0-26.9, adult: Secondary | ICD-10-CM | POA: Diagnosis not present

## 2016-03-25 DIAGNOSIS — E784 Other hyperlipidemia: Secondary | ICD-10-CM | POA: Diagnosis not present

## 2016-03-25 DIAGNOSIS — I480 Paroxysmal atrial fibrillation: Secondary | ICD-10-CM | POA: Diagnosis not present

## 2016-03-25 DIAGNOSIS — Z0000 Encounter for general adult medical examination without abnormal findings: Secondary | ICD-10-CM | POA: Diagnosis not present

## 2016-03-25 DIAGNOSIS — D7589 Other specified diseases of blood and blood-forming organs: Secondary | ICD-10-CM | POA: Diagnosis not present

## 2016-04-12 ENCOUNTER — Encounter: Payer: Self-pay | Admitting: Internal Medicine

## 2016-04-17 ENCOUNTER — Other Ambulatory Visit: Payer: Self-pay | Admitting: Internal Medicine

## 2016-04-22 ENCOUNTER — Ambulatory Visit (INDEPENDENT_AMBULATORY_CARE_PROVIDER_SITE_OTHER): Payer: Medicare HMO | Admitting: Internal Medicine

## 2016-04-22 ENCOUNTER — Encounter: Payer: Self-pay | Admitting: Internal Medicine

## 2016-04-22 VITALS — BP 132/72 | HR 50 | Ht 71.0 in | Wt 186.2 lb

## 2016-04-22 DIAGNOSIS — I480 Paroxysmal atrial fibrillation: Secondary | ICD-10-CM | POA: Diagnosis not present

## 2016-04-22 DIAGNOSIS — I10 Essential (primary) hypertension: Secondary | ICD-10-CM

## 2016-04-22 NOTE — Progress Notes (Signed)
PCP: [name], MD Primary Cardiologist:  Dr [place] Primary EP: Roanna Epley John Sheppard. is a 76 y.o. male who presents today for routine electrophysiology followup.  Since last being seen in our clinic, the patient reports doing well.   He is unaware of any afib.  Today, he denies symptoms of palpitations, chest pain, shortness of breath,  lower extremity edema, dizziness, presyncope, further neuro symptoms, or syncope.  The patient is otherwise without complaint today.   Past Medical History:  Diagnosis Date  . Allergy   . Atrial flutter ([place])    s/p CTI ablation 10/20/09  . Hyperthyroidism   . Macular degeneration   . Paroxysmal atrial fibrillation ([place])    s/p PVI 10/20/09, 10/19/10   Past Surgical History:  Procedure Laterality Date  . ATRIAL ABLATION SURGERY     PVI and CTI ablation by [facility] 10/20/09 and 10/19/10  . CARDIOVERSION     2001  . CATARACT EXTRACTION     july, 2012  . COLONOSCOPY    . POLYPECTOMY    . TRANSESOPHAGEAL ECHOCARDIOGRAM     9/11  EF: 60-65%    ROS- all systems are reviewed and negatives except as per HPI above  Current Outpatient Prescriptions  Medication Sig Dispense Refill  . clopidogrel (PLAVIX) 75 MG tablet Take 1 tablet (75 mg total) by mouth daily. 30 tablet 11  . ferrous sulfate 325 (65 FE) MG tablet Take 325 mg by mouth daily with breakfast.    . Multiple Vitamin (MULTIVITAMIN) tablet Take 1 tablet by mouth daily.      . Multiple Vitamins-Minerals (PRESERVISION/LUTEIN PO) Take 1 capsule by mouth daily.     . Omega-3 Fatty Acids (FISH OIL PO) Take 1 capsule by mouth daily.     . pravastatin (PRAVACHOL) 40 MG tablet Take 1 tablet by mouth daily.    . sotalol (BETAPACE) 160 MG tablet Take 1 tablet (160 mg total) by mouth 2 (two) times daily. Please keep upcoming appointment 180 tablet 0   No current facility-administered medications for this visit.     Physical Exam: Vitals:   04/22/16 1145  BP: 132/72  Pulse: (!) 50  SpO2: 96%   Weight: 186 lb 4 oz (84.5 kg)  Height: 5\' 11"  (1.803 m)    GEN- The patient is well appearing, alert and oriented x 3 today.   Head- normocephalic, atraumatic Eyes-  Sclera clear, conjunctiva pink Ears- hearing intact Oropharynx- clear Lungs- Clear to ausculation bilaterally, normal work of breathing Heart- Regular rate and rhythm, no murmurs, rubs or gallops, PMI not laterally displaced GI- soft, NT, ND, + BS Extremities- no clubbing, cyanosis, or edema  ekg today reveals sinus rhythm 50 bpm, Qtc 404 msec, otherwise normal ekg  Assessment and Plan:  1. afib Well controlled s/p ablation He is reluctant to stop sotalol but could almost certainly do so He is asymptomatic with bradycardia No changes today chad2vasc is 1. He is on ASA  2. HL Stable No change required today  3. Blurred vision, difficulty with name finding Followed by Dr Jaynee Eagles who has advised plavix  4. HTN Stable No change required today  He wishes for Dr Philip Aspen to follow BMET, Mg twice per year  Return to see me in 1 year  Thompson Grayer MD, [facility] 04/22/2016 12:01 PM

## 2016-04-22 NOTE — Patient Instructions (Signed)

## 2016-05-23 ENCOUNTER — Other Ambulatory Visit: Payer: Self-pay

## 2016-05-23 MED ORDER — CLOPIDOGREL BISULFATE 75 MG PO TABS: 75.0000 mg | ORAL_TABLET | Freq: Every day | ORAL | 0 refills | Status: DC

## 2016-05-23 NOTE — Telephone Encounter (Signed)
E-scribed refill per faxed request from pharmacy. Pt needs appt for any additional refills.

## 2016-06-24 ENCOUNTER — Other Ambulatory Visit: Payer: Self-pay | Admitting: Neurology

## 2016-06-25 ENCOUNTER — Other Ambulatory Visit: Payer: Self-pay | Admitting: Internal Medicine

## 2016-06-26 NOTE — Telephone Encounter (Signed)
Please call the patient and ask.  It looks like from medication history that it taken off at [facility] as completed course).  Verify with patient

## 2016-06-26 NOTE — Telephone Encounter (Signed)
Spoke with patient and he stated that he is taking this medication. Okay to go ahead and put back on med list and refill?

## 2016-06-27 ENCOUNTER — Telehealth: Payer: Self-pay | Admitting: Neurology

## 2016-06-27 MED ORDER — CLOPIDOGREL BISULFATE 75 MG PO TABS: 75.0000 mg | ORAL_TABLET | Freq: Every day | ORAL | 0 refills | Status: DC

## 2016-06-27 NOTE — Telephone Encounter (Signed)
30 day refill e-scribed to pt's pharmacy as requested.

## 2016-06-27 NOTE — Telephone Encounter (Signed)
Pt's wife said he is out of clopidogrel (PLAVIX) 75 MG tablet. An appt was scheduled with [name] 5/21. She is asking for RX to refilled  Thank you

## 2016-07-01 ENCOUNTER — Encounter (INDEPENDENT_AMBULATORY_CARE_PROVIDER_SITE_OTHER): Payer: Self-pay

## 2016-07-01 ENCOUNTER — Encounter: Payer: Self-pay | Admitting: Nurse Practitioner

## 2016-07-01 ENCOUNTER — Ambulatory Visit (INDEPENDENT_AMBULATORY_CARE_PROVIDER_SITE_OTHER): Payer: Medicare HMO | Admitting: Nurse Practitioner

## 2016-07-01 VITALS — BP 136/74 | HR 46 | Ht 71.0 in | Wt 186.6 lb

## 2016-07-01 DIAGNOSIS — I480 Paroxysmal atrial fibrillation: Secondary | ICD-10-CM | POA: Diagnosis not present

## 2016-07-01 DIAGNOSIS — H539 Unspecified visual disturbance: Secondary | ICD-10-CM

## 2016-07-01 DIAGNOSIS — Z7901 Long term (current) use of anticoagulants: Secondary | ICD-10-CM

## 2016-07-01 MED ORDER — CLOPIDOGREL BISULFATE 75 MG PO TABS: 75.0000 mg | ORAL_TABLET | Freq: Every day | ORAL | 3 refills | Status: DC

## 2016-07-01 NOTE — Patient Instructions (Addendum)
Stressed the importance of management of risk factors to prevent further stroke Continue Plavix for secondary stroke prevention will refill for 1 year then obtain from PCP Maintain strict control of hypertension with blood pressure goal below 130/90, today's reading 136/74 continue betapace Cholesterol with LDL cholesterol less than 70, followed by primary care,  Continue Pravastatin Continue walking for exercise Eat healthy diet with whole grains,  fresh fruits and vegetables Discharge from stroke clinic Stroke Prevention Some medical conditions and behaviors are associated with an increased chance of having a stroke. You may prevent a stroke by making healthy choices and managing medical conditions. How can I reduce my risk of having a stroke?  Stay physically active. Get at least 30 minutes of activity on most or all days.  Do not smoke. It may also be helpful to avoid exposure to secondhand smoke.  Limit alcohol use. Moderate alcohol use is considered to be:  No more than 2 drinks per day for men.  No more than 1 drink per day for nonpregnant women.  Eat healthy foods. This involves:  Eating 5 or more servings of fruits and vegetables a day.  Making dietary changes that address high blood pressure (hypertension), high cholesterol, diabetes, or obesity.  Manage your cholesterol levels.  Making food choices that are high in fiber and low in saturated fat, trans fat, and cholesterol may control cholesterol levels.  Take any prescribed medicines to control cholesterol as directed by your health care provider.  Manage your diabetes.  Controlling your carbohydrate and sugar intake is recommended to manage diabetes.  Take any prescribed medicines to control diabetes as directed by your health care provider.  Control your hypertension.  Making food choices that are low in salt (sodium), saturated fat, trans fat, and cholesterol is recommended to manage hypertension.  Ask your  health care provider if you need treatment to lower your blood pressure. Take any prescribed medicines to control hypertension as directed by your health care provider.  If you are 27-66 years of age, have your blood pressure checked every 3-5 years. If you are 13 years of age or older, have your blood pressure checked every year.  Maintain a healthy weight.  Reducing calorie intake and making food choices that are low in sodium, saturated fat, trans fat, and cholesterol are recommended to manage weight.  Stop drug abuse.  Avoid taking birth control pills.  Talk to your health care provider about the risks of taking birth control pills if you are over 25 years old, smoke, get migraines, or have ever had a blood clot.  Get evaluated for sleep disorders (sleep apnea).  Talk to your health care provider about getting a sleep evaluation if you snore a lot or have excessive sleepiness.  Take medicines only as directed by your health care provider.  For some people, aspirin or blood thinners (anticoagulants) are helpful in reducing the risk of forming abnormal blood clots that can lead to stroke. If you have the irregular heart rhythm of atrial fibrillation, you should be on a blood thinner unless there is a good reason you cannot take them.  Understand all your medicine instructions.  Make sure that other conditions (such as anemia or atherosclerosis) are addressed. Get help right away if:  You have sudden weakness or numbness of the face, arm, or leg, especially on one side of the body.  Your face or eyelid droops to one side.  You have sudden confusion.  You have trouble speaking (aphasia) or  understanding.  You have sudden trouble seeing in one or both eyes.  You have sudden trouble walking.  You have dizziness.  You have a loss of balance or coordination.  You have a sudden, severe headache with no known cause.  You have new chest pain or an irregular heartbeat. Any of  these symptoms may represent a serious problem that is an emergency. Do not wait to see if the symptoms will go away. Get medical help at once. Call your local emergency services (911 in [place] Do not drive yourself to the hospital. This information is not intended to replace advice given to you by your health care provider. Make sure you discuss any questions you have with your health care provider. Document Released: 03/07/2004 Document Revised: 07/06/2015 Document Reviewed: 07/31/2012 Elsevier Interactive Patient Education  2017 [employer].

## 2016-07-01 NOTE — Progress Notes (Signed)
[facility]  PATIENT: John Sheppard. DOB: [birth year]   REASON FOR VISIT: Follow-up for history of stroke HISTORY FROM: Patient    HISTORY OF PRESENT ILLNESS:UPDATE 05/21/2018CM Mr. John Sheppard, 76 year old male returns for follow-up with history of transient vision changes. MRI of the brain 05/25/2015 with mild scattered periventricular and subcortical chronic small vessel ischemic disease. No acute findings. MRA of the head No focal stenosis or occlusions noted.  Normal variant branch pattern of intracranial circulation. Specifically, the posterior circulation is hypoplastic and the bilateral posterior  cerebral arteries arise from the anterior circulation. Also, the bilateral anterior cerebral arteries arise primarily from the left sided circulation. Carotid Doppler was negative for significant stenosis. He continues to see [name],  Cardiology patient has history of ablation for atrial fibrillation. He remains on Plavix with no further stroke or TIA symptoms. He walks a mile every day and does flexibility exercises. Blood pressure in the office today 136/74. He remains on pravachol for his hyperlipidemia without complaints of myalgias. He returns for reevaluation HISTORY 05/12/15 [name]. is a 76 y.o. male here as a referral from Dr. Philip Aspen and [name] for vision changes. Past medical history of atrial fibrillation, atrial flutter, status post ablation and cardioversion, macular degeneration, hypothyroidism. He has had 2 ablations and does not have afib anymore. He has had 4 episodes of transient vision changes over the last 4-5 years with vision changes in the right eye, gets blurry, he has trouble remembering names in the room, goes on for 4-10 minutes. No known triggers. Two of these have occurred in the last 4-6 weeks. Last episode was a week ago. When symptoms occur, he can follow the conversation. Last happened a week ago. He has trouble reading during  the episodes because of his vision loss. He also has macular degeneration of the right eye. He is scheduled for an ESI on Monday for LBP. No other associated symptoms with the blurred vision(no headache) except for slight confusion. When it would happen he would say his ABCs, he could say the ABCs and there was no loss of awareness just blurry periphery in the right eye only. He goes to Dr. Nolon Stalls for ophthalmology and he has not mentioned it to her yet. No other associated symptoms with episodes, not lightheaded, not dizzy, no SOB, no CP, not diaphoretic, no headaches. Episodes last 4-5 minutes. Blurriness with lines in it. No history of migraines. No associated headache. No associated numbness or tingling. He has been in a room full of people and no one notices anything different. No other focal neurologic symptoms. He is currently just on aspirin, he discontinued warfarin after his ablation.   REVIEW OF SYSTEMS: Full 14 system review of systems performed and notable only for those listed, all others are neg:  Constitutional: neg  Cardiovascular: neg Ear/Nose/Throat: neg  Skin: neg Eyes: neg Respiratory: neg Gastroitestinal: neg  Hematology/Lymphatic: neg  Endocrine: neg Musculoskeletal:neg Allergy/Immunology: neg Neurological: neg Psychiatric: neg Sleep : neg   ALLERGIES: No Known Allergies  HOME MEDICATIONS: Outpatient Medications Prior to Visit  Medication Sig Dispense Refill  . clopidogrel (PLAVIX) 75 MG tablet Take 1 tablet (75 mg total) by mouth daily. 30 tablet 0  . ferrous sulfate 325 (65 FE) MG tablet Take 325 mg by mouth daily with breakfast.    . Multiple Vitamin (MULTIVITAMIN) tablet Take 1 tablet by mouth daily.      . Multiple Vitamins-Minerals (PRESERVISION/LUTEIN PO) Take 1 capsule by mouth daily.     Marland Kitchen  Omega-3 Fatty Acids (FISH OIL PO) Take 1 capsule by mouth daily.     . pravastatin (PRAVACHOL) 40 MG tablet Take 1 tablet by mouth daily.    . sotalol (BETAPACE)  160 MG tablet Take 1 tablet (160 mg total) by mouth 2 (two) times daily. Please keep upcoming appointment 180 tablet 0   No facility-administered medications prior to visit.     PAST MEDICAL HISTORY: Past Medical History:  Diagnosis Date  . Allergy   . Atrial flutter ([place])    s/p CTI ablation 10/20/09  . Hyperthyroidism   . Macular degeneration   . Paroxysmal atrial fibrillation ([place])    s/p PVI 10/20/09, 10/19/10    PAST SURGICAL HISTORY: Past Surgical History:  Procedure Laterality Date  . ATRIAL ABLATION SURGERY     PVI and CTI ablation by [facility] 10/20/09 and 10/19/10  . CARDIOVERSION     2001  . CATARACT EXTRACTION     july, 2012  . COLONOSCOPY    . POLYPECTOMY    . TRANSESOPHAGEAL ECHOCARDIOGRAM     9/11  EF: 60-65%    FAMILY HISTORY: Family History  Problem Relation Age of Onset  . Heart failure Father        DECEASED  . Colon cancer Neg Hx   . Stomach cancer Neg Hx     SOCIAL HISTORY: Social History   Social History  . Marital status: Married    Spouse name: [name]  . Number of children: 2  . Years of education: 16   Occupational History  . Retired    Social History Main Topics  . Smoking status: Never Smoker  . Smokeless tobacco: Never Used  . Alcohol use 1.2 oz/week    2 Cans of beer per week  . Drug use: No  . Sexual activity: Not on file   Other Topics Concern  . Not on file   Social History Narrative   Lives w/ wife   Caffeine use:  Drinks tea/diet pepsi daily     PHYSICAL EXAM  Vitals:   07/01/16 1249  BP: 136/74  Pulse: (!) 46  Weight: 186 lb 9.6 oz (84.6 kg)  Height: 5' 11" (1.803 m)   Body mass index is 26.03 kg/m.  Generalized: Well developed, in no acute distress  Head: normocephalic and atraumatic,. Oropharynx benign  Neck: Supple, no carotid bruits  Cardiac: Regular rate rhythm, no murmur  Musculoskeletal: No deformity   Neurological examination   Mentation: Alert oriented to time, place, history taking. Attention span  and concentration appropriate. Recent and remote memory intact.  Follows all commands speech and language fluent.   Cranial nerve II-XII: .Pupils were equal round reactive to light extraocular movements were full, visual field were full on confrontational test. Facial sensation and strength were normal. hearing was intact to finger rubbing bilaterally. Uvula tongue midline. head turning and shoulder shrug were normal and symmetric.Tongue protrusion into cheek strength was normal. Motor: normal bulk and tone, full strength in the BUE, BLE, fine finger movements normal, no pronator drift. No focal weakness Sensory: normal and symmetric to light touch, In the upper and lower extremities Coordination: finger-nose-finger, heel-to-shin bilaterally, no dysmetria Reflexes: Brachioradialis 2/2, biceps 2/2, triceps 2/2, patellar absent left patellar right 2/2, Achilles 2/2, plantar responses were flexor bilaterally. Gait and Station: Rising up from seated position without assistance, normal stance,  moderate stride, good arm swing, smooth turning, able to perform tiptoe, and heel walking without difficulty. Tandem gait is steady. No assistive device  DIAGNOSTIC DATA (LABS, IMAGING, TESTING) - I reviewed patient records, labs, notes, testing and imaging myself where available.  Lab Results  Component Value Date   WBC 5.7 10/12/2010   HGB 12.5 (L) 10/12/2010   HCT 37.6 (L) 10/12/2010   MCV 92.7 10/12/2010   PLT 199.0 10/12/2010      Component Value Date/Time   NA 139 10/12/2010 1128   K 4.3 10/12/2010 1128   CL 108 10/12/2010 1128   CO2 26 10/12/2010 1128   GLUCOSE 89 10/12/2010 1128   BUN 14 10/12/2010 1128   CREATININE 0.8 10/12/2010 1128   CREATININE 0.91 10/08/2010 1619   CALCIUM 9.0 10/12/2010 1128   GFRNONAA >60 10/19/2009 0616   GFRAA  10/19/2009 0616    >60        The eGFR has been calculated using the MDRD equation. This calculation has not been validated in all  clinical situations. eGFR's persistently <60 mL/min signify possible Chronic Kidney Disease.    Lab Results  Component Value Date   TSH 1.79 07/03/2009      ASSESSMENT AND PLAN Mr. Limas 76 year old male here with transient monocular vision changes.Transient vision loss may indicate underlying vascular disease, including carotid occlusion and thromboembolism, or it may have a more benign etiology, such as migraine or vasospasm. He also has a history of atrial fibrillation . MRI of the brain 05/25/2015 with mild scattered periventricular and subcortical chronic small vessel ischemic disease. No acute findings. MRA of the head No focal stenosis or occlusions noted.  Normal variant branch pattern of intracranial circulation. Specifically, the posterior circulation is hypoplastic and the bilateral posterior cerebral arteries arise from the anterior circulation. Also, the bilateral anterior cerebral arteries arise primarily from the left sided circulation. Carotid Doppler was negative for significant stenosis.  Stressed the importance of management of risk factors to prevent further stroke/TIA Continue Plavix for secondary stroke prevention will refill for 1 year then obtain from PCP Maintain strict control of hypertension with blood pressure goal below 130/90, today's reading 136/74 continue betapace Cholesterol with LDL cholesterol less than 70, followed by primary care,  Continue Pravastatin Continue walking for exercise Eat healthy diet with whole grains,  fresh fruits and vegetables Discharge from stroke clinic Dennie Bible, [facility], [facility], [facility] [address] [place], [place] [address] [phone]

## 2016-07-09 NOTE — Progress Notes (Signed)
Personally  participated in, made any corrections needed, and agree with history, physical, neuro exam,assessment and plan as stated above.    Zacchaeus Halm, MD [facility] 

## 2016-07-10 ENCOUNTER — Other Ambulatory Visit: Payer: Self-pay

## 2016-07-10 MED ORDER — CLOPIDOGREL BISULFATE 75 MG PO TABS: 75.0000 mg | ORAL_TABLET | Freq: Every day | ORAL | 3 refills | Status: DC

## 2016-07-10 NOTE — Telephone Encounter (Signed)
Pt wife calling back to know if Dr Jaynee Eagles would need to do anything re: pt wanting to have the prescription for Plavix filled through [facility] right source, please call to advise

## 2016-07-10 NOTE — Telephone Encounter (Signed)
Pt saw [name] NP on 07/01/16. Rx refills re-sent to mail order pharmacy as requested.

## 2016-07-10 NOTE — Telephone Encounter (Signed)
Patients wife called back in regards to refilling the lisinopril she stated that she believes that Dr Rayann Heman informed them that the medication was optional which may be why it was removed from his med list. Patient is taking this medication. Okay to refill? Please advise. Thanks, MI

## 2016-07-11 NOTE — Telephone Encounter (Signed)
Spoke with Janan Halter, RN and she stated okay to refill until patients next follow up.

## 2016-08-03 ENCOUNTER — Other Ambulatory Visit: Payer: Self-pay | Admitting: Internal Medicine

## 2016-10-07 DIAGNOSIS — H353131 Nonexudative age-related macular degeneration, bilateral, early dry stage: Secondary | ICD-10-CM | POA: Diagnosis not present

## 2016-10-07 DIAGNOSIS — Z961 Presence of intraocular lens: Secondary | ICD-10-CM | POA: Diagnosis not present

## 2016-10-07 DIAGNOSIS — H35371 Puckering of macula, right eye: Secondary | ICD-10-CM | POA: Diagnosis not present

## 2016-10-07 DIAGNOSIS — H5213 Myopia, bilateral: Secondary | ICD-10-CM | POA: Diagnosis not present

## 2017-01-07 DIAGNOSIS — Z6827 Body mass index (BMI) 27.0-27.9, adult: Secondary | ICD-10-CM | POA: Diagnosis not present

## 2017-01-07 DIAGNOSIS — M7750 Other enthesopathy of unspecified foot: Secondary | ICD-10-CM | POA: Diagnosis not present

## 2017-03-14 DIAGNOSIS — M7662 Achilles tendinitis, left leg: Secondary | ICD-10-CM | POA: Diagnosis not present

## 2017-03-24 DIAGNOSIS — Z8639 Personal history of other endocrine, nutritional and metabolic disease: Secondary | ICD-10-CM | POA: Diagnosis not present

## 2017-03-24 DIAGNOSIS — Z125 Encounter for screening for malignant neoplasm of prostate: Secondary | ICD-10-CM | POA: Diagnosis not present

## 2017-03-24 DIAGNOSIS — R82998 Other abnormal findings in urine: Secondary | ICD-10-CM | POA: Diagnosis not present

## 2017-03-24 DIAGNOSIS — E7849 Other hyperlipidemia: Secondary | ICD-10-CM | POA: Diagnosis not present

## 2017-03-24 DIAGNOSIS — I10 Essential (primary) hypertension: Secondary | ICD-10-CM | POA: Diagnosis not present

## 2017-03-27 DIAGNOSIS — M7662 Achilles tendinitis, left leg: Secondary | ICD-10-CM | POA: Diagnosis not present

## 2017-03-31 DIAGNOSIS — E7849 Other hyperlipidemia: Secondary | ICD-10-CM | POA: Diagnosis not present

## 2017-03-31 DIAGNOSIS — Z0000 Encounter for general adult medical examination without abnormal findings: Secondary | ICD-10-CM | POA: Diagnosis not present

## 2017-03-31 DIAGNOSIS — M545 Low back pain: Secondary | ICD-10-CM | POA: Diagnosis not present

## 2017-03-31 DIAGNOSIS — Z6826 Body mass index (BMI) 26.0-26.9, adult: Secondary | ICD-10-CM | POA: Diagnosis not present

## 2017-03-31 DIAGNOSIS — M79672 Pain in left foot: Secondary | ICD-10-CM | POA: Diagnosis not present

## 2017-03-31 DIAGNOSIS — Z1389 Encounter for screening for other disorder: Secondary | ICD-10-CM | POA: Diagnosis not present

## 2017-03-31 DIAGNOSIS — I480 Paroxysmal atrial fibrillation: Secondary | ICD-10-CM | POA: Diagnosis not present

## 2017-03-31 DIAGNOSIS — I10 Essential (primary) hypertension: Secondary | ICD-10-CM | POA: Diagnosis not present

## 2017-04-01 DIAGNOSIS — M7662 Achilles tendinitis, left leg: Secondary | ICD-10-CM | POA: Diagnosis not present

## 2017-04-04 DIAGNOSIS — M7662 Achilles tendinitis, left leg: Secondary | ICD-10-CM | POA: Diagnosis not present

## 2017-04-14 DIAGNOSIS — M7662 Achilles tendinitis, left leg: Secondary | ICD-10-CM | POA: Diagnosis not present

## 2017-04-18 DIAGNOSIS — M7662 Achilles tendinitis, left leg: Secondary | ICD-10-CM | POA: Diagnosis not present

## 2017-04-28 ENCOUNTER — Other Ambulatory Visit: Payer: Self-pay | Admitting: Internal Medicine

## 2017-05-21 ENCOUNTER — Encounter: Payer: Self-pay | Admitting: Internal Medicine

## 2017-05-26 ENCOUNTER — Ambulatory Visit: Payer: Medicare HMO | Admitting: Internal Medicine

## 2017-05-26 ENCOUNTER — Encounter: Payer: Self-pay | Admitting: Internal Medicine

## 2017-05-26 VITALS — BP 120/80 | HR 50 | Ht 71.0 in | Wt 193.0 lb

## 2017-05-26 DIAGNOSIS — I480 Paroxysmal atrial fibrillation: Secondary | ICD-10-CM | POA: Diagnosis not present

## 2017-05-26 DIAGNOSIS — I10 Essential (primary) hypertension: Secondary | ICD-10-CM | POA: Diagnosis not present

## 2017-05-26 NOTE — Patient Instructions (Signed)

## 2017-05-26 NOTE — Progress Notes (Signed)
PCP: [name], MD   Primary EP: Dr Roanna Epley Edrees Valent. is a 77 y.o. male who presents today for routine electrophysiology followup.  Since last being seen in our clinic, the patient reports doing very well.  Today, he denies symptoms of palpitations, chest pain, shortness of breath,  lower extremity edema, dizziness, presyncope, or syncope.  The patient is otherwise without complaint today.   Past Medical History:  Diagnosis Date  . Allergy   . Atrial flutter ([place])    s/p CTI ablation 10/20/09  . Hyperthyroidism   . Macular degeneration   . Paroxysmal atrial fibrillation ([place])    s/p PVI 10/20/09, 10/19/10   Past Surgical History:  Procedure Laterality Date  . ATRIAL ABLATION SURGERY     PVI and CTI ablation by [facility] 10/20/09 and 10/19/10  . CARDIOVERSION     2001  . CATARACT EXTRACTION     july, 2012  . COLONOSCOPY    . POLYPECTOMY    . TRANSESOPHAGEAL ECHOCARDIOGRAM     9/11  EF: 60-65%    ROS- all systems are reviewed and negatives except as per HPI above  Current Outpatient Medications  Medication Sig Dispense Refill  . clopidogrel (PLAVIX) 75 MG tablet Take 1 tablet (75 mg total) by mouth daily. 90 tablet 3  . ferrous sulfate 325 (65 FE) MG tablet Take 325 mg by mouth daily with breakfast.    . lisinopril (PRINIVIL,ZESTRIL) 5 MG tablet Take 1 tablet (5 mg total) by mouth daily. Please keep 4-15 at 11 am appointment for additional refills thanks. 90 tablet 0  . Multiple Vitamin (MULTIVITAMIN) tablet Take 1 tablet by mouth daily.      . Multiple Vitamins-Minerals (PRESERVISION/LUTEIN PO) Take 1 capsule by mouth daily.     . Omega-3 Fatty Acids (FISH OIL PO) Take 1 capsule by mouth daily.     . pravastatin (PRAVACHOL) 40 MG tablet Take 1 tablet by mouth daily.    . sotalol (BETAPACE) 160 MG tablet Take 1 tablet (160 mg total) by mouth 2 (two) times daily. Please keep 4-15 at 11 am appointment for additional refills thanks. 180 tablet 0   No current  facility-administered medications for this visit.     Physical Exam: Vitals:   05/26/17 1120  BP: 120/80  Pulse: (!) 50  Weight: 193 lb (87.5 kg)  Height: 5\' 11"  (1.803 m)    GEN- The patient is well appearing, alert and oriented x 3 today.   Head- normocephalic, atraumatic Eyes-  Sclera clear, conjunctiva pink Ears- hearing intact Oropharynx- clear Lungs- Clear to ausculation bilaterally, normal work of breathing Heart- Regular rate and rhythm, no murmurs, rubs or gallops, PMI not laterally displaced GI- soft, NT, ND, + BS Extremities- no clubbing, cyanosis, or edema  EKG tracing ordered today is personally reviewed and shows sinus rhythm 50 bpm, PR 196 msec, Qtc 412 msec  Assessment and Plan:  1. afib Well controlled s/p ablation Reluctant to stop sotalol chads2vasc score is 2.  On plavix We discussed guideline directed anticoagulation at length today.  DOAC therapy was advised.  He is clear that he does not wish to make any changes at this time.  We discussed Apple Watch v 4 as an option to know if he is having asymptomatic episodes of afib.  He has a flip phone and is not interested in this technology.  2. HL Stable No change required today  3. HTN Stable No change required today  Return in a  year  Thompson Grayer MD, [facility] 05/26/2017 11:52 AM

## 2017-06-16 ENCOUNTER — Encounter (HOSPITAL_COMMUNITY): Payer: Self-pay

## 2017-06-16 ENCOUNTER — Emergency Department (HOSPITAL_COMMUNITY)
Admission: EM | Admit: 2017-06-16 | Discharge: 2017-06-17 | Disposition: A | Discharge status: Home / Self Care | Payer: Medicare HMO | Attending: Emergency Medicine | Admitting: Emergency Medicine

## 2017-06-16 ENCOUNTER — Emergency Department (HOSPITAL_COMMUNITY): Payer: Medicare HMO

## 2017-06-16 DIAGNOSIS — R0600 Dyspnea, unspecified: Secondary | ICD-10-CM | POA: Diagnosis not present

## 2017-06-16 DIAGNOSIS — R030 Elevated blood-pressure reading, without diagnosis of hypertension: Secondary | ICD-10-CM | POA: Insufficient documentation

## 2017-06-16 DIAGNOSIS — Z79899 Other long term (current) drug therapy: Secondary | ICD-10-CM | POA: Diagnosis not present

## 2017-06-16 DIAGNOSIS — I10 Essential (primary) hypertension: Secondary | ICD-10-CM | POA: Diagnosis not present

## 2017-06-16 DIAGNOSIS — E039 Hypothyroidism, unspecified: Secondary | ICD-10-CM | POA: Diagnosis not present

## 2017-06-16 DIAGNOSIS — R0602 Shortness of breath: Secondary | ICD-10-CM | POA: Diagnosis not present

## 2017-06-16 LAB — I-STAT TROPONIN, ED: TROPONIN I, POC: 0 ng/mL (ref 0.00–0.08)

## 2017-06-16 LAB — CBC WITH DIFFERENTIAL/PLATELET
BASOS ABS: 0 10*3/uL (ref 0.0–0.1)
Basophils Relative: 1 %
EOS ABS: 0.3 10*3/uL (ref 0.0–0.7)
EOS PCT: 5 %
HCT: 37.1 % — ABNORMAL LOW (ref 39.0–52.0)
Hemoglobin: 12.7 g/dL — ABNORMAL LOW (ref 13.0–17.0)
Lymphocytes Relative: 17 %
Lymphs Abs: 1.1 10*3/uL (ref 0.7–4.0)
MCH: 32.6 pg (ref 26.0–34.0)
MCHC: 34.2 g/dL (ref 30.0–36.0)
MCV: 95.4 fL (ref 78.0–100.0)
MONO ABS: 0.7 10*3/uL (ref 0.1–1.0)
Monocytes Relative: 10 %
Neutro Abs: 4.4 10*3/uL (ref 1.7–7.7)
Neutrophils Relative %: 67 %
Platelets: 195 10*3/uL (ref 150–400)
RBC: 3.89 MIL/uL — ABNORMAL LOW (ref 4.22–5.81)
RDW: 13.1 % (ref 11.5–15.5)
WBC: 6.5 10*3/uL (ref 4.0–10.5)

## 2017-06-16 LAB — BASIC METABOLIC PANEL
ANION GAP: 9 (ref 5–15)
BUN: 12 mg/dL (ref 6–20)
CHLORIDE: 106 mmol/L (ref 101–111)
CO2: 25 mmol/L (ref 22–32)
Calcium: 9.2 mg/dL (ref 8.9–10.3)
Creatinine, Ser: 0.88 mg/dL (ref 0.61–1.24)
GFR calc non Af Amer: >60 mL/min (ref >60)
Glucose, Bld: 106 mg/dL — ABNORMAL HIGH (ref 65–99)
Potassium: 4.3 mmol/L (ref 3.5–5.1)
SODIUM: 140 mmol/L (ref 135–145)

## 2017-06-16 LAB — D-DIMER, QUANTITATIVE (NOT AT ARMC)

## 2017-06-16 NOTE — ED Triage Notes (Signed)
Pt states that he became SOB earlier today that resolved and then returned, states that he feels anxious. Denies CP, cough, fevers, hx of afib.

## 2017-06-16 NOTE — ED Provider Notes (Signed)
Patient placed in Quick Look pathway, seen and evaluated   Chief Complaint: SOB and elevated BP  HPI:   Pt reports sitting down earlier today when he started feeling SOB. States walked this morning with no symptoms. Pt states "I think its better" but states when he thinks about it he still short of breath. No chest pain. No cough or congestion. PT states he has had afib in the past with ablations. No pain or swelling in extremities. No long car rides, travel, surgeries.   ROS: Positive for shortness of breath.  Negative for palpitations, cough, chest pain, swelling in extremities, orthopnea.  Physical Exam:   Gen: No distress  Neuro: Awake and Alert  Skin: Warm    Focused Exam: No acute distress.  Lungs are clear to auscultation bilaterally.  Regular heart rate and rhythm.  No murmurs.  No peripheral edema.  Peripheral pulses intact.   Initiation of care has begun. The patient has been counseled on the process, plan, and necessity for staying for the completion/evaluation, and the remainder of the medical screening examination  Patient with sudden onset of shortness of breath while sitting down.  Went on a walk earlier with no symptoms.  No prior cardiac history other than A. fib.  Will get EKG, given acute onset of symptoms, will get a d-dimer.  Troponin and lab work ordered.  Patient's blood pressure noted to be very high, 214/101.  Will monitor and recheck.  Patient takes lisinopril at bedtime, has not taken it this evening yet. States BP is very unusual for him.   Vitals:   06/16/17 2025  BP: (!) 214/101  Pulse: (!) 59  Resp: 18  Temp: 98.3 F (36.8 C)  TempSrc: Oral  SpO2: 100%       Jeannett Senior, PA-C 06/16/17 2048    Noemi Chapel, MD 06/18/17 [phone]

## 2017-06-17 ENCOUNTER — Other Ambulatory Visit: Payer: Self-pay

## 2017-06-17 LAB — I-STAT TROPONIN, ED: TROPONIN I, POC: 0.01 ng/mL (ref 0.00–0.08)

## 2017-06-17 LAB — BRAIN NATRIURETIC PEPTIDE: B NATRIURETIC PEPTIDE 5: 97.5 pg/mL (ref 0.0–100.0)

## 2017-06-17 MED ORDER — LISINOPRIL 2.5 MG PO TABS
5.0000 mg | ORAL_TABLET | Freq: Once | ORAL | Status: AC
  Administered 2017-06-17: 5 mg via ORAL
  Filled 2017-06-17: qty 2

## 2017-06-17 MED ORDER — CLONIDINE HCL 0.1 MG PO TABS
0.1000 mg | ORAL_TABLET | Freq: Once | ORAL | Status: AC
  Administered 2017-06-17: 0.1 mg via ORAL
  Filled 2017-06-17: qty 1

## 2017-06-17 NOTE — ED Notes (Signed)
Patient ambulated from triage to room. No difficulty breathing noted. Patient reports shortness of breathe has relived while waiting in lobby. Wife at bedside. MD notified.

## 2017-06-17 NOTE — ED Provider Notes (Signed)
[facility] EMERGENCY DEPARTMENT Provider Note   CSN: [id number] Arrival date & time: 06/16/17  2014     History   Chief Complaint Chief Complaint  Patient presents with  . Shortness of Breath    HPI John Sheppard. is a 77 y.o. male.  HPI Patient with history of atrial fibrillation status post ablation and hypertension.  Developed shortness of breath yesterday evening.  Not related to exertion or lying flat.  No chest pain or tightness.  No cough, fever or chills.  Denies any new lower extremity swelling.  States he took his blood pressure and it was elevated.  Denies palpitations or irregular heartbeat.  States that shortness of breath has completely resolved.  Patient takes lisinopril every evening for hypertension.  Did not take his dose last night. Past Medical History:  Diagnosis Date  . Allergy   . Atrial flutter ([place])    s/p CTI ablation 10/20/09  . Hyperthyroidism   . Macular degeneration   . Paroxysmal atrial fibrillation ([place])    s/p PVI 10/20/09, 10/19/10    Patient Active Problem List   Diagnosis Date Noted  . Tendonitis, Achilles, left 03/27/2017  . Visual changes 05/08/2015  . Bradycardia 09/13/2010  . Paroxysmal atrial fibrillation ([facility])   . Chronic anticoagulation   . Macular degeneration   . Hyperthyroidism   . ATRIAL FIBRILLATION 08/02/2009  . ATRIAL FLUTTER, PAROXYSMAL 08/02/2009    Past Surgical History:  Procedure Laterality Date  . ATRIAL ABLATION SURGERY     PVI and CTI ablation by [facility] 10/20/09 and 10/19/10  . CARDIOVERSION     2001  . CATARACT EXTRACTION     july, 2012  . COLONOSCOPY    . POLYPECTOMY    . TRANSESOPHAGEAL ECHOCARDIOGRAM     9/11  EF: 60-65%        Home Medications    Prior to Admission medications   Medication Sig Start Date End Date Taking? Authorizing Provider  clopidogrel (PLAVIX) 75 MG tablet Take 1 tablet (75 mg total) by mouth daily. 07/10/16   Dennie Bible, NP  ferrous sulfate 325 (65  FE) MG tablet Take 325 mg by mouth daily with breakfast.    [provider]  lisinopril (PRINIVIL,ZESTRIL) 5 MG tablet Take 1 tablet (5 mg total) by mouth daily. Please keep 4-15 at 11 am appointment for additional refills thanks. 04/28/17   Allred, Jeneen Rinks, MD  Multiple Vitamin (MULTIVITAMIN) tablet Take 1 tablet by mouth daily.      [provider]  Multiple Vitamins-Minerals (PRESERVISION/LUTEIN PO) Take 1 capsule by mouth daily.     [provider]  Omega-3 Fatty Acids (FISH OIL PO) Take 1 capsule by mouth daily.     [provider]  pravastatin (PRAVACHOL) 40 MG tablet Take 1 tablet by mouth daily. 01/31/14   [provider]  sotalol (BETAPACE) 160 MG tablet Take 1 tablet (160 mg total) by mouth 2 (two) times daily. Please keep 4-15 at 11 am appointment for additional refills thanks. 04/28/17   Thompson Grayer, MD    Family History Family History  Problem Relation Age of Onset  . Heart failure Father        DECEASED  . Colon cancer Neg Hx   . Stomach cancer Neg Hx     Social History Social History   Tobacco Use  . Smoking status: Never Smoker  . Smokeless tobacco: Never Used  Substance Use Topics  . Alcohol use: Yes  Alcohol/week: 1.2 oz    Types: 2 Cans of beer per week  . Drug use: No     Allergies   Patient has no known allergies.   Review of Systems Review of Systems  Constitutional: Negative for chills and fever.  HENT: Negative for congestion and sinus pressure.   Respiratory: Positive for shortness of breath. Negative for cough, chest tightness and wheezing.   Cardiovascular: Negative for chest pain, palpitations and leg swelling.  Gastrointestinal: Negative for abdominal pain, constipation, diarrhea, nausea and vomiting.  Musculoskeletal: Negative for back pain, myalgias and neck pain.  Skin: Negative for rash and wound.  Neurological: Negative for dizziness, weakness, light-headedness, numbness and headaches.  All  other systems reviewed and are negative.    Physical Exam Updated Vital Signs BP (!) 141/82 (BP Location: Right Arm)   Pulse (!) 55   Temp 97.8 F (36.6 C) (Oral)   Resp 16   Ht 5\' 11"  (1.803 m)   Wt 87.5 kg (193 lb)   SpO2 94%   BMI 26.92 kg/m   Physical Exam  Constitutional: He is oriented to person, place, and time. He appears well-developed and well-nourished. No distress.  HENT:  Head: Normocephalic and atraumatic.  Mouth/Throat: Oropharynx is clear and moist.  Eyes: Pupils are equal, round, and reactive to light. EOM are normal.  Neck: Normal range of motion. Neck supple. No JVD present.  Cardiovascular: Normal rate and regular rhythm. Exam reveals no gallop and no friction rub.  No murmur heard. Pulmonary/Chest: Effort normal and breath sounds normal. No stridor. No respiratory distress. He has no wheezes. He has no rales. He exhibits no tenderness.  Abdominal: Soft. Bowel sounds are normal. There is no tenderness. There is no rebound and no guarding.  Musculoskeletal: Normal range of motion. He exhibits no edema or tenderness.  No lower ext swelling, symmetry or tenderness.  Distal pulses are 2+.  Neurological: He is alert and oriented to person, place, and time.  Moves all extremities without focal deficit.  Sensation fully intact.  Skin: Skin is warm and dry. No rash noted. He is not diaphoretic. No erythema.  Psychiatric: He has a normal mood and affect. His behavior is normal.  Nursing note and vitals reviewed.    ED Treatments / Results  Labs (all labs ordered are listed, but only abnormal results are displayed) Labs Reviewed  CBC WITH DIFFERENTIAL/PLATELET - Abnormal; Notable for the following components:      Result Value   RBC 3.89 (*)    Hemoglobin 12.7 (*)    HCT 37.1 (*)    All other components within normal limits  BASIC METABOLIC PANEL - Abnormal; Notable for the following components:   Glucose, Bld 106 (*)    All other components within normal  limits  D-DIMER, QUANTITATIVE (NOT AT [facility])  BRAIN NATRIURETIC PEPTIDE  I-STAT TROPONIN, ED  I-STAT TROPONIN, ED    EKG EKG Interpretation  Date/Time:  Monday Jun 16 2017 20:22:27 EDT Ventricular Rate:  61 PR Interval:  194 QRS Duration: 82 QT Interval:  416 QTC Calculation: 418 R Axis:   68 Text Interpretation:  Normal sinus rhythm Normal ECG No significant change since last tracing Confirmed by Julianne Rice [phone]) on 06/17/2017 7:26:09 AM   Radiology Dg Chest 2 View  Result Date: 06/16/2017 CLINICAL DATA:  Shortness of breath.  Anxious. EXAM: CHEST - 2 VIEW COMPARISON:  None. FINDINGS: The heart size and mediastinal contours are within normal limits. Both lungs are clear. The visualized  skeletal structures are unremarkable. IMPRESSION: No active cardiopulmonary disease. Electronically Signed   By: Staci Righter M.D.   On: 06/16/2017 21:26    Procedures Procedures (including critical care time)  Medications Ordered in ED Medications  lisinopril (PRINIVIL,ZESTRIL) tablet 5 mg (5 mg Oral Given 06/17/17 0808)  cloNIDine (CATAPRES) tablet 0.1 mg (0.1 mg Oral Given 06/17/17 5329)     Initial Impression / Assessment and Plan / ED Course  I have reviewed the triage vital signs and the nursing notes.  Pertinent labs & imaging results that were available during my care of the patient were reviewed by me and considered in my medical decision making (see chart for details).     Patient remains asymptomatic in the emergency department.  Blood pressure is significantly improved after given oral blood pressure medication.  Work-up is essentially negative including troponin x2 which is normal.  Question whether dyspnea is related to elevated blood pressure.  Patient is advised to follow-up closely with his cardiologist.  Return precautions have been given.  Final Clinical Impressions(s) / ED Diagnoses   Final diagnoses:  Dyspnea, unspecified type  Transient elevated blood pressure     ED Discharge Orders    None       Julianne Rice, MD 06/18/17 [phone]

## 2017-06-23 ENCOUNTER — Telehealth: Payer: Self-pay | Admitting: Internal Medicine

## 2017-06-23 NOTE — Telephone Encounter (Signed)
Returned call to Pt's wife. Per wife Pt has continued to have sob off and on since discharge from hospital.  Pt will not take BP at home, thinks it's falsely elevated because he's nervous to take it. Pt has increase lisinopril to 10 mg daily (on his own).  Pt states he feels like he needs a stress test. Pt with f/u appt 07/04/2017 at 12:00 pm, feels like he needs to be seen sooner. Will discuss with [name].

## 2017-06-23 NOTE — Telephone Encounter (Signed)
New Message   Pt c/o Shortness Of Breath: STAT if SOB developed within the last 24 hours or pt is noticeably SOB on the phone  1. Are you currently SOB (can you hear that pt is SOB on the phone)? No. I did not speak with patient spoke with spouse.  2. How long have you been experiencing SOB? On and off for a little over a week  3. Are you SOB when sitting or when up moving around? Whenever. He can be sitting or moving   4. Are you currently experiencing any other symptoms? No. But last Monday his BP was up.

## 2017-06-24 NOTE — Telephone Encounter (Signed)
Call returned to Pt wife.  Able to see Pt 06/25/2017 at 1:45 pm.  Wife indicates understanding.  Appt made.

## 2017-06-25 ENCOUNTER — Encounter: Payer: Self-pay | Admitting: Internal Medicine

## 2017-06-25 ENCOUNTER — Ambulatory Visit: Payer: Medicare HMO | Admitting: Internal Medicine

## 2017-06-25 VITALS — BP 136/76 | HR 54 | Ht 71.0 in | Wt 188.0 lb

## 2017-06-25 DIAGNOSIS — I480 Paroxysmal atrial fibrillation: Secondary | ICD-10-CM

## 2017-06-25 DIAGNOSIS — I10 Essential (primary) hypertension: Secondary | ICD-10-CM | POA: Diagnosis not present

## 2017-06-25 DIAGNOSIS — R0602 Shortness of breath: Secondary | ICD-10-CM

## 2017-06-25 MED ORDER — LISINOPRIL 10 MG PO TABS: 10.0000 mg | ORAL_TABLET | Freq: Every day | ORAL | 3 refills | Status: DC

## 2017-06-25 MED ORDER — HYDROCHLOROTHIAZIDE 12.5 MG PO CAPS: 12.5000 mg | ORAL_CAPSULE | Freq: Every day | ORAL | 3 refills | Status: DC

## 2017-06-25 NOTE — Patient Instructions (Addendum)
Medication Instructions:  Your physician has recommended you make the following change in your medication: 1.  Increase your lisinopril- Take 10 mg by mouth daily.  You may take 2 of your 5 mg tablets until your new 10 mg tablets arrive via mail. 2.  Start taking hydrochlorothiazide 12.5 mg one tablet by mouth daily.  Labwork: None ordered.  Testing/Procedures: Your physician has requested that you have an echocardiogram. Echocardiography is a painless test that uses sound waves to create images of your heart. It provides your doctor with information about the size and shape of your heart and how well your heart's chambers and valves are working. This procedure takes approximately one hour. There are no restrictions for this procedure.  Please schedule for ECHO.  Your physician has requested that you have cardiac CT. Cardiac computed tomography (CT) is a painless test that uses an x-ray machine to take clear, detailed pictures of your heart. For further information please visit HugeFiesta.[email or web address]. Please follow instruction sheet as given.  You will be contacted to schedule this CT after it is approved by your insurance.  Follow-Up: Your physician wants you to follow-up in: 6 weeks with [name].     Any Other Special Instructions Will Be Listed Below (If Applicable).  Please reduce your sodium intake.  Get less than 3 grams daily.  If you need a refill on your cardiac medications before your next appointment, please call your pharmacy.      Please arrive at the [facility] main entrance of [facility] at xx:xx AM (30-45 minutes prior to test start time)  [facility] [place], [place] [address] [phone]  Proceed to the [facility] Radiology Department (First Floor).  Please follow these instructions carefully (unless otherwise directed):  Hold all erectile dysfunction medications at least 48 hours prior to test.  On the Night Before  the Test: . Drink plenty of water. . Do not consume any caffeinated/decaffeinated beverages or chocolate 12 hours prior to your test. . Do not take any antihistamines 12 hours prior to your test.  On the Day of the Test: . Drink plenty of water. Do not drink any water within one hour of the test. . Do not eat any food 4 hours prior to the test. . You may take your regular medications prior to the test. Hold your hydrochlorothiazide the AM of the procedure.  After the Test: . Drink plenty of water. . After receiving IV contrast, you may experience a mild flushed feeling. This is normal. . On occasion, you may experience a mild rash up to 24 hours after the test. This is not dangerous. If this occurs, you can take Benadryl 25 mg and increase your fluid intake. . If you experience trouble breathing, this can be serious. If it is severe call 911 IMMEDIATELY. If it is mild, please call our office.

## 2017-06-25 NOTE — Progress Notes (Signed)
PCP: [name], MD   Primary EP: Dr Roanna Epley Kwali Wrinkle. is a 77 y.o. male who presents today for routine electrophysiology followup. He presents for urgent follow-up after an ER visit 06/16/17 for hypertensive urgency.  He reports SOB with associated BP of 200/80s.  He presented to [facility] ED and workup was negative.  He denies chest pain.  He has continued to have some elevation in BP.  SOB is better.  Today, he denies symptoms of palpitations,  lower extremity edema, dizziness, presyncope, or syncope.  The patient is otherwise without complaint today.   Past Medical History:  Diagnosis Date  . Allergy   . Atrial flutter ([place])    s/p CTI ablation 10/20/09  . Hyperthyroidism   . Macular degeneration   . Paroxysmal atrial fibrillation ([place])    s/p PVI 10/20/09, 10/19/10   Past Surgical History:  Procedure Laterality Date  . ATRIAL ABLATION SURGERY     PVI and CTI ablation by [facility] 10/20/09 and 10/19/10  . CARDIOVERSION     2001  . CATARACT EXTRACTION     july, 2012  . COLONOSCOPY    . POLYPECTOMY    . TRANSESOPHAGEAL ECHOCARDIOGRAM     9/11  EF: 60-65%    ROS- all systems are reviewed and negatives except as per HPI above  Current Outpatient Medications  Medication Sig Dispense Refill  . clopidogrel (PLAVIX) 75 MG tablet Take 1 tablet (75 mg total) by mouth daily. 90 tablet 3  . ferrous sulfate 325 (65 FE) MG tablet Take 325 mg by mouth daily with breakfast.    . lisinopril (PRINIVIL,ZESTRIL) 5 MG tablet Take 1 tablet (5 mg total) by mouth daily. Please keep 4-15 at 11 am appointment for additional refills thanks. 90 tablet 0  . Multiple Vitamin (MULTIVITAMIN) tablet Take 1 tablet by mouth daily.      . Multiple Vitamins-Minerals (PRESERVISION/LUTEIN PO) Take 1 capsule by mouth daily.     . Omega-3 Fatty Acids (FISH OIL PO) Take 1 capsule by mouth daily.     . pravastatin (PRAVACHOL) 40 MG tablet Take 1 tablet by mouth daily.    . sotalol (BETAPACE) 160 MG tablet Take 1  tablet (160 mg total) by mouth 2 (two) times daily. Please keep 4-15 at 11 am appointment for additional refills thanks. 180 tablet 0   No current facility-administered medications for this visit.     Physical Exam: Vitals:   06/25/17 1347  BP: 136/76  Pulse: (!) 54  Weight: 188 lb (85.3 kg)  Height: 5\' 11"  (1.803 m)    GEN- The patient is well appearing, alert and oriented x 3 today.   Head- normocephalic, atraumatic Eyes-  Sclera clear, conjunctiva pink Ears- hearing intact Neck- + JVD Oropharynx- clear Lungs- Clear to ausculation bilaterally, normal work of breathing Heart- Regular rate and rhythm, no murmurs, rubs or gallops, PMI not laterally displaced GI- soft, NT, ND, + BS Extremities- no clubbing, cyanosis, or edema  EKG tracing ordered today is personally reviewed and shows sinus rhythm 54 bpm, PR 194 msec, QRS 86 msec, Qtc 432 msec, no ischemic symptoms  Assessment and Plan:  1. Hypertensive urgency Better but with ongoing elevation in BP I suspect dietary sodium may be the cause.  He has mild volume overload on exam. Will increase lisinopril to 10mg  daily Add hctz 12.5mg  daily, though I have cautioned that he has to be careful to not dehydrate Lifestyle modification including 3 g sodium diet, avoidance  of decongestants, stimulants, NSAIDs Echo to evaluate for structural changes  2. SOB Treat elevated BP as above Avoid sodium and start hctz as above Echo as above Cardiac CT with FFR to evaluate for ischemic changes as the cause  3. afib Well controlled On plaivx  4. HTN Stable No change required today  5. HL Stable No change required today  Return in 6 weeks for follow-up  Thompson Grayer MD, [facility] 06/25/2017 2:04 PM

## 2017-06-30 ENCOUNTER — Other Ambulatory Visit: Payer: Self-pay

## 2017-06-30 ENCOUNTER — Ambulatory Visit (HOSPITAL_COMMUNITY): Payer: Medicare HMO | Attending: Internal Medicine

## 2017-06-30 DIAGNOSIS — I4892 Unspecified atrial flutter: Secondary | ICD-10-CM | POA: Diagnosis not present

## 2017-06-30 DIAGNOSIS — R0602 Shortness of breath: Secondary | ICD-10-CM | POA: Insufficient documentation

## 2017-06-30 DIAGNOSIS — I10 Essential (primary) hypertension: Secondary | ICD-10-CM

## 2017-06-30 DIAGNOSIS — I119 Hypertensive heart disease without heart failure: Secondary | ICD-10-CM | POA: Diagnosis not present

## 2017-07-03 ENCOUNTER — Telehealth: Payer: Self-pay | Admitting: Internal Medicine

## 2017-07-03 DIAGNOSIS — R0602 Shortness of breath: Secondary | ICD-10-CM

## 2017-07-03 NOTE — Telephone Encounter (Signed)
New Message:      Pt is calling to see if the Dr. Would like for them to be seen after the CT scan or is it okay to come before on 07/30/17

## 2017-07-03 NOTE — Telephone Encounter (Signed)
New Message     Please put in order for BMET - (hospital requesting it to be done week before or it will delay test)   Ct scheduled for 07/31/17 1030a

## 2017-07-04 ENCOUNTER — Ambulatory Visit: Payer: Medicare HMO | Admitting: Internal Medicine

## 2017-07-04 NOTE — Telephone Encounter (Signed)
appt rescheduled.

## 2017-07-24 ENCOUNTER — Other Ambulatory Visit: Payer: Medicare HMO | Admitting: *Deleted

## 2017-07-24 DIAGNOSIS — R0602 Shortness of breath: Secondary | ICD-10-CM

## 2017-07-24 LAB — BASIC METABOLIC PANEL
BUN/Creatinine Ratio: 14 (ref 10–24)
BUN: 12 mg/dL (ref 8–27)
CALCIUM: 9.5 mg/dL (ref 8.6–10.2)
CO2: 25 mmol/L (ref 20–29)
CREATININE: 0.84 mg/dL (ref 0.76–1.27)
Chloride: 98 mmol/L (ref 96–106)
GFR calc Af Amer: 98 mL/min/{1.73_m2} (ref >59)
GFR, EST NON AFRICAN AMERICAN: 85 mL/min/{1.73_m2} (ref >59)
Glucose: 104 mg/dL — ABNORMAL HIGH (ref 65–99)
Potassium: 4.3 mmol/L (ref 3.5–5.2)
Sodium: 136 mmol/L (ref 134–144)

## 2017-07-30 ENCOUNTER — Ambulatory Visit: Payer: Medicare HMO | Admitting: Internal Medicine

## 2017-07-31 ENCOUNTER — Ambulatory Visit (HOSPITAL_COMMUNITY)
Admission: RE | Admit: 2017-07-31 | Discharge: 2017-07-31 | Disposition: A | Discharge status: Home / Self Care | Payer: Medicare HMO | Source: Ambulatory Visit | Attending: Internal Medicine | Admitting: Internal Medicine

## 2017-07-31 DIAGNOSIS — R079 Chest pain, unspecified: Secondary | ICD-10-CM | POA: Diagnosis not present

## 2017-07-31 DIAGNOSIS — R0602 Shortness of breath: Secondary | ICD-10-CM | POA: Insufficient documentation

## 2017-07-31 DIAGNOSIS — I700 Atherosclerosis of aorta: Secondary | ICD-10-CM | POA: Insufficient documentation

## 2017-07-31 DIAGNOSIS — R911 Solitary pulmonary nodule: Secondary | ICD-10-CM | POA: Diagnosis not present

## 2017-07-31 DIAGNOSIS — I10 Essential (primary) hypertension: Secondary | ICD-10-CM | POA: Diagnosis not present

## 2017-07-31 MED ORDER — NITROGLYCERIN 0.4 MG SL SUBL
SUBLINGUAL_TABLET | SUBLINGUAL | Status: AC
  Filled 2017-07-31: qty 1

## 2017-07-31 MED ORDER — IOPAMIDOL (ISOVUE-370) INJECTION 76%
100.0000 mL | Freq: Once | INTRAVENOUS | Status: AC | PRN
  Administered 2017-07-31: 80 mL via INTRAVENOUS

## 2017-08-01 ENCOUNTER — Ambulatory Visit (HOSPITAL_COMMUNITY): Payer: Medicare HMO

## 2017-08-01 ENCOUNTER — Ambulatory Visit (HOSPITAL_COMMUNITY)
Admission: RE | Admit: 2017-08-01 | Discharge: 2017-08-01 | Disposition: A | Discharge status: Home / Self Care | Payer: Medicare HMO | Source: Ambulatory Visit | Attending: Internal Medicine | Admitting: Internal Medicine

## 2017-08-01 DIAGNOSIS — R0602 Shortness of breath: Secondary | ICD-10-CM | POA: Diagnosis not present

## 2017-08-01 DIAGNOSIS — I10 Essential (primary) hypertension: Secondary | ICD-10-CM | POA: Insufficient documentation

## 2017-08-04 ENCOUNTER — Telehealth: Payer: Self-pay

## 2017-08-04 NOTE — Telephone Encounter (Signed)
Call placed to Pt. Notified of abnormal cardiac CT results and recommendation for cardiac cath.   Pt would like to keep upcoming appt with [name] to discuss.

## 2017-08-05 ENCOUNTER — Other Ambulatory Visit: Payer: Self-pay | Admitting: Internal Medicine

## 2017-08-05 MED ORDER — SOTALOL HCL 160 MG PO TABS: 160.0000 mg | ORAL_TABLET | Freq: Two times a day (BID) | ORAL | 3 refills | Status: DC

## 2017-08-11 ENCOUNTER — Encounter: Payer: Self-pay | Admitting: Internal Medicine

## 2017-08-11 ENCOUNTER — Ambulatory Visit: Payer: Medicare HMO | Admitting: Internal Medicine

## 2017-08-11 VITALS — BP 120/60 | HR 53 | Ht 71.0 in | Wt 184.0 lb

## 2017-08-11 DIAGNOSIS — R0602 Shortness of breath: Secondary | ICD-10-CM | POA: Diagnosis not present

## 2017-08-11 DIAGNOSIS — I480 Paroxysmal atrial fibrillation: Secondary | ICD-10-CM | POA: Diagnosis not present

## 2017-08-11 DIAGNOSIS — Z01812 Encounter for preprocedural laboratory examination: Secondary | ICD-10-CM

## 2017-08-11 DIAGNOSIS — I10 Essential (primary) hypertension: Secondary | ICD-10-CM

## 2017-08-11 DIAGNOSIS — R9389 Abnormal findings on diagnostic imaging of other specified body structures: Secondary | ICD-10-CM | POA: Diagnosis not present

## 2017-08-11 LAB — BASIC METABOLIC PANEL
BUN/Creatinine Ratio: 18 (ref 10–24)
BUN: 17 mg/dL (ref 8–27)
CALCIUM: 9.5 mg/dL (ref 8.6–10.2)
CHLORIDE: 99 mmol/L (ref 96–106)
CO2: 23 mmol/L (ref 20–29)
Creatinine, Ser: 0.94 mg/dL (ref 0.76–1.27)
GFR calc Af Amer: 91 mL/min/{1.73_m2} (ref >59)
GFR calc non Af Amer: 78 mL/min/{1.73_m2} (ref >59)
Glucose: 95 mg/dL (ref 65–99)
POTASSIUM: 4.6 mmol/L (ref 3.5–5.2)
Sodium: 135 mmol/L (ref 134–144)

## 2017-08-11 LAB — CBC WITH DIFFERENTIAL/PLATELET
BASOS ABS: 0 10*3/uL (ref 0.0–0.2)
Basos: 0 %
EOS (ABSOLUTE): 0.2 10*3/uL (ref 0.0–0.4)
Eos: 2 %
Hematocrit: 38.3 % (ref 37.5–51.0)
Hemoglobin: 13 g/dL (ref 13.0–17.7)
IMMATURE GRANS (ABS): 0.1 10*3/uL (ref 0.0–0.1)
Immature Granulocytes: 1 %
LYMPHS: 12 %
Lymphocytes Absolute: 1.2 10*3/uL (ref 0.7–3.1)
MCH: 32.7 pg (ref 26.6–33.0)
MCHC: 33.9 g/dL (ref 31.5–35.7)
MCV: 97 fL (ref 79–97)
Monocytes Absolute: 1 10*3/uL — ABNORMAL HIGH (ref 0.1–0.9)
Monocytes: 11 %
Neutrophils Absolute: 7.1 10*3/uL — ABNORMAL HIGH (ref 1.4–7.0)
Neutrophils: 74 %
PLATELETS: 263 10*3/uL (ref 150–450)
RBC: 3.97 x10E6/uL — ABNORMAL LOW (ref 4.14–5.80)
RDW: 13.3 % (ref 12.3–15.4)
WBC: 9.5 10*3/uL (ref 3.4–10.8)

## 2017-08-11 NOTE — H&P (View-Only) (Signed)
PCP: [name], MD   Primary EP: Dr Roanna Epley Kairav Russomanno. is a 77 y.o. male who presents today for routine electrophysiology followup.  Since last being seen in our clinic, the patient reports doing very well.  SOB is improved.  Today, he denies symptoms of palpitations, chest pain, shortness of breath,  lower extremity edema, dizziness, presyncope, or syncope.  The patient is otherwise without complaint today.   Past Medical History:  Diagnosis Date  . Allergy   . Atrial flutter ([place])    s/p CTI ablation 10/20/09  . Hyperthyroidism   . Macular degeneration   . Paroxysmal atrial fibrillation ([place])    s/p PVI 10/20/09, 10/19/10   Past Surgical History:  Procedure Laterality Date  . ATRIAL ABLATION SURGERY     PVI and CTI ablation by [facility] 10/20/09 and 10/19/10  . CARDIOVERSION     2001  . CATARACT EXTRACTION     july, 2012  . COLONOSCOPY    . POLYPECTOMY    . TRANSESOPHAGEAL ECHOCARDIOGRAM     9/11  EF: 60-65%    ROS- all systems are reviewed and negatives except as per HPI above  Current Outpatient Medications  Medication Sig Dispense Refill  . clopidogrel (PLAVIX) 75 MG tablet Take 1 tablet (75 mg total) by mouth daily. 90 tablet 3  . ferrous sulfate 325 (65 FE) MG tablet Take 325 mg by mouth daily with breakfast.    . hydrochlorothiazide (MICROZIDE) 12.5 MG capsule Take 1 capsule (12.5 mg total) by mouth daily. 90 capsule 3  . lisinopril (PRINIVIL,ZESTRIL) 10 MG tablet Take 1 tablet (10 mg total) by mouth daily. 90 tablet 3  . Multiple Vitamin (MULTIVITAMIN) tablet Take 1 tablet by mouth daily.      . Multiple Vitamins-Minerals (PRESERVISION/LUTEIN PO) Take 1 capsule by mouth daily.     . Omega-3 Fatty Acids (FISH OIL PO) Take 1 capsule by mouth daily.     . pravastatin (PRAVACHOL) 40 MG tablet Take 1 tablet by mouth daily.    Marland Kitchen PREDNISONE PO Take by mouth as directed.    . sotalol (BETAPACE) 160 MG tablet Take 1 tablet (160 mg total) by mouth 2 (two) times daily. 180  tablet 3   No current facility-administered medications for this visit.     Physical Exam: Vitals:   08/11/17 0921  BP: 120/60  Pulse: (!) 53  Weight: 184 lb (83.5 kg)  Height: 5\' 11"  (1.803 m)    GEN- The patient is well appearing, alert and oriented x 3 today.   Head- normocephalic, atraumatic Eyes-  Sclera clear, conjunctiva pink Ears- hearing intact Oropharynx- clear Lungs- Clear to ausculation bilaterally, normal work of breathing Heart- Regular rate and rhythm, no murmurs, rubs or gallops, PMI not laterally displaced GI- soft, NT, ND, + BS Extremities- no clubbing, cyanosis, or edema  Wt Readings from Last 3 Encounters:  08/11/17 184 lb (83.5 kg)  06/25/17 188 lb (85.3 kg)  06/17/17 193 lb (87.5 kg)    EKG tracing ordered today is personally reviewed and shows sinus rhythm 53 bpm, PR 184 msec, QRS 96 msec, Qtc 412 msec, otherwise normal ekg  Assessment and Plan:  1. SOB improved Cardiac CT reviewed at length with patient and wife today. Dr Aundra Dubin and I have also discussed.  Dr Aundra Dubin advises cath. I would therefore recommend left heart catheterization with possible PCI.  Discussed the cath with the patient. The patient understands that risks included but are not limited to stroke (  1 in 1000), death (1 in 62), kidney failure [usually temporary] (1 in 500), bleeding (1 in 200), allergic reaction [possibly serious] (1 in 200). The patient understands and agrees to proceed.  We will schedule at the next available time.  2. Hypertension Improved Stable No change required today  3. afib Well controlled On plavix (chads2vasc score is 3, possibly 4 if CAD). He has previously declined DOAC therapy  4. HL Stable No change required today  return to see me in 3 months  Thompson Grayer MD, [facility] 08/11/2017 9:53 AM

## 2017-08-11 NOTE — Patient Instructions (Addendum)
Medication Instructions:  Your physician recommends that you continue on your current medications as directed. Please refer to the Current Medication list given to you today.  Labwork: Today: BMET & CBC     *We will only notify you of abnormal results, otherwise continue current treatment plan.  Testing/Procedures: Your physician has requested that you have a cardiac catheterization. Cardiac catheterization is used to diagnose and/or treat various heart conditions. Doctors may recommend this procedure for a number of different reasons. The most common reason is to evaluate chest pain. Chest pain can be a symptom of coronary artery disease (CAD), and cardiac catheterization can show whether plaque is narrowing or blocking your heart's arteries. This procedure is also used to evaluate the valves, as well as measure the blood flow and oxygen levels in different parts of your heart. For further information please visit HugeFiesta.[email or web address]. Please follow instruction below located under special instructions.   Follow-Up: Your physician recommends that you schedule a follow-up appointment in: 3 months with [name].   * If you need a refill on your cardiac medications before your next appointment, please call your pharmacy.   *Please note that any paperwork needing to be filled out by the provider will need to be addressed at the front desk prior to seeing the provider. Please note that any FMLA, disability or other documents regarding health condition is subject to a $25.00 charge that must be received prior to completion of paperwork in the form of a money order or check.  Thank you for choosing [facility]!!    Any Other Special Instructions Will Be Listed Below (If Applicable).  CATH INSTRUCTIONS  You are scheduled for a Cardiac Catheterization on Wednesday, July 10 with Dr. [name].  1. Please arrive at the [facility] (Main Entrance A) at [facility]: [place], [place] [address] at 9:00 AM (two hours before your procedure to ensure your preparation). Free valet parking service is available.   Special note: Every effort is made to have your procedure done on time. Please understand that emergencies sometimes delay scheduled procedures.  2. Diet: Do not eat or drink anything after midnight prior to your procedure except sips of water to take medications.  3. Labs: You will need to have blood drawn on Monday, July 1 at [facility] at [place] do not need to be fasting.  4. Medication instructions in preparation for your procedure: Hold Hydrochlorothiazide the morning of your procedure.  On the morning of your procedure, take your Plavix/Clopidogrel and any morning medicines NOT listed above.  ALSO, take one 81 mg Aspirin the morning of this procedure. You may use sips of water.  5. Plan for one night stay--bring personal belongings. 6. Bring a current list of your medications and current insurance cards. 7. You MUST have a responsible person to drive you home. 8. Someone MUST be with you the first 24 hours after you arrive home or your discharge will be delayed. 9. Please wear clothes that are easy to get on and off and wear slip-on shoes.  Thank you for allowing [place] to care for you!   -- [facility]      Coronary Angiogram A coronary angiogram is an X-ray procedure that is used to examine the arteries in the heart. In this procedure, a dye (contrast dye) is injected through a long, thin tube (catheter). The catheter is inserted through the groin, wrist, or arm. The dye  is injected into each artery, then X-rays are taken to show if there is a blockage in the arteries of the heart. This procedure can also show if you have valve disease or a disease of the aorta, and it can be used to check the overall function of your heart muscle. You may have a coronary angiogram if:  You are having chest  pain, or other symptoms of angina, and you are at risk for heart disease.  You have an abnormal electrocardiogram (ECG) or stress test.  You have chest pain and heart failure.  You are having irregular heart rhythms.  You and your health care provider determine that the benefits of the test information outweigh the risks of the procedure.  Let your health care provider know about:  Any allergies you have, including allergies to contrast dye.  All medicines you are taking, including vitamins, herbs, eye drops, creams, and over-the-counter medicines.  Any problems you or family members have had with anesthetic medicines.  Any blood disorders you have.  Any surgeries you have had.  History of kidney problems or kidney failure.  Any medical conditions you have.  Whether you are pregnant or may be pregnant. What are the risks? Generally, this is a safe procedure. However, problems may occur, including:  Infection.  Allergic reaction to medicines or dyes that are used.  Bleeding from the access site or other locations.  Kidney injury, especially in people with impaired kidney function.  Stroke (rare).  Heart attack (rare).  Damage to other structures or organs.  What happens before the procedure? Staying hydrated Follow instructions from your health care provider about hydration, which may include:  Up to 2 hours before the procedure - you may continue to drink clear liquids, such as water, clear fruit juice, black coffee, and plain tea.  Eating and drinking restrictions Follow instructions from your health care provider about eating and drinking, which may include:  8 hours before the procedure - stop eating heavy meals or foods such as meat, fried foods, or fatty foods.  6 hours before the procedure - stop eating light meals or foods, such as toast or cereal.  2 hours before the procedure - stop drinking clear liquids.  General instructions  Ask your health  care provider about: ? Changing or stopping your regular medicines. This is especially important if you are taking diabetes medicines or blood thinners. ? Taking medicines such as ibuprofen. These medicines can thin your blood. Do not take these medicines before your procedure if your health care provider instructs you not to, though aspirin may be recommended prior to coronary angiograms.  Plan to have someone take you home from the hospital or clinic.  You may need to have blood tests or X-rays done. What happens during the procedure?  An IV tube will be inserted into one of your veins.  You will be given one or more of the following: ? A medicine to help you relax (sedative). ? A medicine to numb the area where the catheter will be inserted into an artery (local anesthetic).  To reduce your risk of infection: ? Your health care team will wash or sanitize their hands. ? Your skin will be washed with soap. ? Hair may be removed from the area where the catheter will be inserted.  You will be connected to a continuous ECG monitor.  The catheter will be inserted into an artery. The location may be in your groin, in your wrist, or in the  fold of your arm (near your elbow).  A type of X-ray (fluoroscopy) will be used to help guide the catheter to the opening of the blood vessel that is being examined.  A dye will be injected into the catheter, and X-rays will be taken. The dye will help to show where any narrowing or blockages are located in the heart arteries.  Tell your health care provider if you have any chest pain or trouble breathing during the procedure.  If blockages are found, your health care provider may perform another procedure, such as inserting a coronary stent. The procedure may vary among health care providers and hospitals. What happens after the procedure?  After the procedure, you will need to keep the area still for a few hours, or for as long as told by your health  care provider. If the procedure is done through the groin, you will be instructed to not bend and not cross your legs.  The insertion site will be checked frequently.  The pulse in your foot or wrist will be checked frequently.  You may have additional blood tests, X-rays, and a test that records the electrical activity of your heart (ECG).  Do not drive for 24 hours if you were given a sedative. Summary  A coronary angiogram is an X-ray procedure that is used to look into the arteries in the heart.  During the procedure, a dye (contrast dye) is injected through a long, thin tube (catheter). The catheter is inserted through the groin, wrist, or arm.  Tell your health care provider about any allergies you have, including allergies to contrast dye.  After the procedure, you will need to keep the area still for a few hours, or for as long as told by your health care provider. This information is not intended to replace advice given to you by your health care provider. Make sure you discuss any questions you have with your health care provider. Document Released: 08/04/2002 Document Revised: 11/10/2015 Document Reviewed: 11/10/2015 Elsevier Interactive Patient Education  [employer].

## 2017-08-11 NOTE — Progress Notes (Signed)
PCP: [name], MD   Primary EP: Dr Roanna Epley Hol[place] Spano. is a 77 y.o. male who presents today for routine electrophysiology followup.  Since last being seen in our clinic, the patient reports doing very well.  SOB is improved.  Today, he denies symptoms of palpitations, chest pain, shortness of breath,  lower extremity edema, dizziness, presyncope, or syncope.  The patient is otherwise without complaint today.   Past Medical History:  Diagnosis Date  . Allergy   . Atrial flutter ([place])    s/p CTI ablation 10/20/09  . Hyperthyroidism   . Macular degeneration   . Paroxysmal atrial fibrillation ([place])    s/p PVI 10/20/09, 10/19/10   Past Surgical History:  Procedure Laterality Date  . ATRIAL ABLATION SURGERY     PVI and CTI ablation by [facility] 10/20/09 and 10/19/10  . CARDIOVERSION     2001  . CATARACT EXTRACTION     july, 2012  . COLONOSCOPY    . POLYPECTOMY    . TRANSESOPHAGEAL ECHOCARDIOGRAM     9/11  EF: 60-65%    ROS- all systems are reviewed and negatives except as per HPI above  Current Outpatient Medications  Medication Sig Dispense Refill  . clopidogrel (PLAVIX) 75 MG tablet Take 1 tablet (75 mg total) by mouth daily. 90 tablet 3  . ferrous sulfate 325 (65 FE) MG tablet Take 325 mg by mouth daily with breakfast.    . hydrochlorothiazide (MICROZIDE) 12.5 MG capsule Take 1 capsule (12.5 mg total) by mouth daily. 90 capsule 3  . lisinopril (PRINIVIL,ZESTRIL) 10 MG tablet Take 1 tablet (10 mg total) by mouth daily. 90 tablet 3  . Multiple Vitamin (MULTIVITAMIN) tablet Take 1 tablet by mouth daily.      . Multiple Vitamins-Minerals (PRESERVISION/LUTEIN PO) Take 1 capsule by mouth daily.     . Omega-3 Fatty Acids (FISH OIL PO) Take 1 capsule by mouth daily.     . pravastatin (PRAVACHOL) 40 MG tablet Take 1 tablet by mouth daily.    Marland Kitchen PREDNISONE PO Take by mouth as directed.    . sotalol (BETAPACE) 160 MG tablet Take 1 tablet (160 mg total) by mouth 2 (two) times daily. 180  tablet 3   No current facility-administered medications for this visit.     Physical Exam: Vitals:   08/11/17 0921  BP: 120/60  Pulse: (!) 53  Weight: 184 lb (83.5 kg)  Height: 5\' 11"  (1.803 m)    GEN- The patient is well appearing, alert and oriented x 3 today.   Head- normocephalic, atraumatic Eyes-  Sclera clear, conjunctiva pink Ears- hearing intact Oropharynx- clear Lungs- Clear to ausculation bilaterally, normal work of breathing Heart- Regular rate and rhythm, no murmurs, rubs or gallops, PMI not laterally displaced GI- soft, NT, ND, + BS Extremities- no clubbing, cyanosis, or edema  Wt Readings from Last 3 Encounters:  08/11/17 184 lb (83.5 kg)  06/25/17 188 lb (85.3 kg)  06/17/17 193 lb (87.5 kg)    EKG tracing ordered today is personally reviewed and shows sinus rhythm 53 bpm, PR 184 msec, QRS 96 msec, Qtc 412 msec, otherwise normal ekg  Assessment and Plan:  1. SOB improved Cardiac CT reviewed at length with patient and wife today. Dr Aundra Dubin and I have also discussed.  Dr Aundra Dubin advises cath. I would therefore recommend left heart catheterization with possible PCI.  Discussed the cath with the patient. The patient understands that risks included but are not limited to stroke (  1 in 1000), death (1 in 88), kidney failure [usually temporary] (1 in 500), bleeding (1 in 200), allergic reaction [possibly serious] (1 in 200). The patient understands and agrees to proceed.  We will schedule at the next available time.  2. Hypertension Improved Stable No change required today  3. afib Well controlled On plavix (chads2vasc score is 3, possibly 4 if CAD). He has previously declined DOAC therapy  4. HL Stable No change required today  return to see me in 3 months  Thompson Grayer MD, [facility] 08/11/2017 9:53 AM

## 2017-08-19 ENCOUNTER — Telehealth: Payer: Self-pay | Admitting: *Deleted

## 2017-08-19 NOTE — Telephone Encounter (Signed)
Pt contacted pre-catheterization scheduled at [facility] for: Wednesday August 20, 2017 11:30 AM Verified arrival time and place: [place] Entrance A at: 9 AM  No solid food after midnight prior to cath, clear liquids until 5 AM day of procedure. Verified no known allergies. Verified no diabetes medications  Hold: HCTZ AM of procedure   Except hold medications AM meds can be  taken pre-cath with sip of water including: ASA 81 mg Clopidogrel 75 mg  Confirmed patient has responsible person to drive home post procedure and for 24 hours after you arrive home:yes

## 2017-08-20 ENCOUNTER — Encounter (HOSPITAL_COMMUNITY)
Admission: RE | Disposition: A | Discharge status: Home / Self Care | Payer: Self-pay | Source: Ambulatory Visit | Attending: Cardiovascular Disease

## 2017-08-20 ENCOUNTER — Other Ambulatory Visit: Payer: Self-pay

## 2017-08-20 ENCOUNTER — Ambulatory Visit (HOSPITAL_COMMUNITY)
Admission: RE | Admit: 2017-08-20 | Discharge: 2017-08-21 | Disposition: A | Discharge status: Home / Self Care | Payer: Medicare HMO | Source: Ambulatory Visit | Attending: Cardiovascular Disease | Admitting: Cardiovascular Disease

## 2017-08-20 ENCOUNTER — Encounter (HOSPITAL_COMMUNITY): Payer: Self-pay | Admitting: Cardiovascular Disease

## 2017-08-20 DIAGNOSIS — I480 Paroxysmal atrial fibrillation: Secondary | ICD-10-CM | POA: Diagnosis present

## 2017-08-20 DIAGNOSIS — I25119 Atherosclerotic heart disease of native coronary artery with unspecified angina pectoris: Secondary | ICD-10-CM

## 2017-08-20 DIAGNOSIS — Z7901 Long term (current) use of anticoagulants: Secondary | ICD-10-CM | POA: Diagnosis not present

## 2017-08-20 DIAGNOSIS — Z8673 Personal history of transient ischemic attack (TIA), and cerebral infarction without residual deficits: Secondary | ICD-10-CM | POA: Insufficient documentation

## 2017-08-20 DIAGNOSIS — Z9849 Cataract extraction status, unspecified eye: Secondary | ICD-10-CM | POA: Insufficient documentation

## 2017-08-20 DIAGNOSIS — H3530 Unspecified macular degeneration: Secondary | ICD-10-CM | POA: Diagnosis not present

## 2017-08-20 DIAGNOSIS — Z79899 Other long term (current) drug therapy: Secondary | ICD-10-CM | POA: Insufficient documentation

## 2017-08-20 DIAGNOSIS — I4892 Unspecified atrial flutter: Secondary | ICD-10-CM | POA: Insufficient documentation

## 2017-08-20 DIAGNOSIS — E0590 Thyrotoxicosis, unspecified without thyrotoxic crisis or storm: Secondary | ICD-10-CM | POA: Insufficient documentation

## 2017-08-20 DIAGNOSIS — Z5181 Encounter for therapeutic drug level monitoring: Secondary | ICD-10-CM

## 2017-08-20 DIAGNOSIS — Z9889 Other specified postprocedural states: Secondary | ICD-10-CM | POA: Diagnosis not present

## 2017-08-20 DIAGNOSIS — I10 Essential (primary) hypertension: Secondary | ICD-10-CM | POA: Diagnosis not present

## 2017-08-20 DIAGNOSIS — I2510 Atherosclerotic heart disease of native coronary artery without angina pectoris: Secondary | ICD-10-CM | POA: Insufficient documentation

## 2017-08-20 DIAGNOSIS — R0602 Shortness of breath: Secondary | ICD-10-CM | POA: Insufficient documentation

## 2017-08-20 DIAGNOSIS — I4891 Unspecified atrial fibrillation: Secondary | ICD-10-CM | POA: Diagnosis present

## 2017-08-20 DIAGNOSIS — Z955 Presence of coronary angioplasty implant and graft: Secondary | ICD-10-CM

## 2017-08-20 HISTORY — PX: RIGHT/LEFT HEART CATH AND CORONARY ANGIOGRAPHY: CATH118266

## 2017-08-20 HISTORY — DX: Other chronic pain: G89.29

## 2017-08-20 HISTORY — PX: CORONARY STENT INTERVENTION: CATH118234

## 2017-08-20 HISTORY — DX: Allergic rhinitis due to pollen: J30.1

## 2017-08-20 HISTORY — DX: Nonexudative age-related macular degeneration, right eye, stage unspecified: H35.3110

## 2017-08-20 HISTORY — DX: Atherosclerotic heart disease of native coronary artery without angina pectoris: I25.10

## 2017-08-20 HISTORY — DX: Pure hypercholesterolemia, unspecified: E78.00

## 2017-08-20 HISTORY — DX: Low back pain: M54.5

## 2017-08-20 HISTORY — DX: Pneumonia, unspecified organism: J18.9

## 2017-08-20 HISTORY — DX: Low back pain, unspecified: M54.50

## 2017-08-20 LAB — POCT I-STAT 3, ART BLOOD GAS (G3+)
Acid-base deficit: 3 mmol/L — ABNORMAL HIGH (ref 0.0–2.0)
BICARBONATE: 21.5 mmol/L (ref 20.0–28.0)
O2 Saturation: 97 %
PCO2 ART: 35.4 mmHg (ref 32.0–48.0)
PH ART: 7.39 (ref 7.350–7.450)
PO2 ART: 93 mmHg (ref 83.0–108.0)
TCO2: 23 mmol/L (ref 22–32)

## 2017-08-20 LAB — POCT ACTIVATED CLOTTING TIME
ACTIVATED CLOTTING TIME: 621 s
Activated Clotting Time: 274 seconds

## 2017-08-20 LAB — POCT I-STAT 3, VENOUS BLOOD GAS (G3P V)
ACID-BASE DEFICIT: 5 mmol/L — AB (ref 0.0–2.0)
BICARBONATE: 20.4 mmol/L (ref 20.0–28.0)
O2 Saturation: 73 %
PCO2 VEN: 36.1 mmHg — AB (ref 44.0–60.0)
PH VEN: 7.36 (ref 7.250–7.430)
TCO2: 21 mmol/L — ABNORMAL LOW (ref 22–32)
pO2, Ven: 40 mmHg (ref 32.0–45.0)

## 2017-08-20 SURGERY — RIGHT/LEFT HEART CATH AND CORONARY ANGIOGRAPHY
Anesthesia: LOCAL

## 2017-08-20 MED ORDER — SODIUM CHLORIDE 0.9 % WEIGHT BASED INFUSION
1.0000 mL/kg/h | INTRAVENOUS | Status: DC
  Administered 2017-08-20: 250 mL via INTRAVENOUS

## 2017-08-20 MED ORDER — FENTANYL CITRATE (PF) 100 MCG/2ML IJ SOLN
INTRAMUSCULAR | Status: DC | PRN
  Administered 2017-08-20: 25 ug via INTRAVENOUS

## 2017-08-20 MED ORDER — ONDANSETRON HCL 4 MG/2ML IJ SOLN: 4.0000 mg | Freq: Four times a day (QID) | INTRAMUSCULAR | Status: DC | PRN

## 2017-08-20 MED ORDER — SOTALOL HCL 80 MG PO TABS
160.0000 mg | ORAL_TABLET | Freq: Two times a day (BID) | ORAL | Status: DC
  Administered 2017-08-20 – 2017-08-21 (×2): 160 mg via ORAL
  Filled 2017-08-20 (×2): qty 2

## 2017-08-20 MED ORDER — LIDOCAINE HCL (PF) 1 % IJ SOLN
INTRAMUSCULAR | Status: AC
  Filled 2017-08-20: qty 30

## 2017-08-20 MED ORDER — SODIUM CHLORIDE 0.9% FLUSH: 3.0000 mL | Freq: Two times a day (BID) | INTRAVENOUS | Status: DC

## 2017-08-20 MED ORDER — SODIUM CHLORIDE 0.9 % IV SOLN
INTRAVENOUS | Status: AC
  Administered 2017-08-20: 16:00:00 via INTRAVENOUS

## 2017-08-20 MED ORDER — SODIUM CHLORIDE 0.9 % WEIGHT BASED INFUSION
3.0000 mL/kg/h | INTRAVENOUS | Status: DC
  Administered 2017-08-20: 3 mL/kg/h via INTRAVENOUS

## 2017-08-20 MED ORDER — SODIUM CHLORIDE 0.9 % IV SOLN: 250.0000 mL | INTRAVENOUS | Status: DC | PRN

## 2017-08-20 MED ORDER — LISINOPRIL 10 MG PO TABS
10.0000 mg | ORAL_TABLET | Freq: Every day | ORAL | Status: DC
  Administered 2017-08-21: 10 mg via ORAL
  Filled 2017-08-20: qty 1

## 2017-08-20 MED ORDER — ACETAMINOPHEN 325 MG PO TABS: 650.0000 mg | ORAL_TABLET | ORAL | Status: DC | PRN

## 2017-08-20 MED ORDER — LABETALOL HCL 5 MG/ML IV SOLN: 10.0000 mg | INTRAVENOUS | Status: AC | PRN

## 2017-08-20 MED ORDER — HEPARIN SODIUM (PORCINE) 1000 UNIT/ML IJ SOLN
INTRAMUSCULAR | Status: DC | PRN
  Administered 2017-08-20: 6000 [IU] via INTRAVENOUS
  Administered 2017-08-20: 2000 [IU] via INTRAVENOUS
  Administered 2017-08-20: 4000 [IU] via INTRAVENOUS

## 2017-08-20 MED ORDER — SODIUM CHLORIDE 0.9% FLUSH: 3.0000 mL | INTRAVENOUS | Status: DC | PRN

## 2017-08-20 MED ORDER — MIDAZOLAM HCL 2 MG/2ML IJ SOLN
INTRAMUSCULAR | Status: AC
  Filled 2017-08-20: qty 2

## 2017-08-20 MED ORDER — ASPIRIN 81 MG PO CHEW: 81.0000 mg | CHEWABLE_TABLET | Freq: Once | ORAL | Status: DC

## 2017-08-20 MED ORDER — HYDRALAZINE HCL 20 MG/ML IJ SOLN: 5.0000 mg | INTRAMUSCULAR | Status: AC | PRN

## 2017-08-20 MED ORDER — HEPARIN (PORCINE) IN NACL 1000-0.9 UT/500ML-% IV SOLN
INTRAVENOUS | Status: AC
  Filled 2017-08-20: qty 1000

## 2017-08-20 MED ORDER — NITROGLYCERIN 1 MG/10 ML FOR IR/CATH LAB
INTRA_ARTERIAL | Status: DC | PRN
  Administered 2017-08-20 (×2): 200 ug via INTRACORONARY

## 2017-08-20 MED ORDER — CLOPIDOGREL BISULFATE 75 MG PO TABS
75.0000 mg | ORAL_TABLET | Freq: Every day | ORAL | Status: DC
  Administered 2017-08-21: 10:00:00 75 mg via ORAL
  Filled 2017-08-20: qty 1

## 2017-08-20 MED ORDER — VERAPAMIL HCL 2.5 MG/ML IV SOLN
INTRAVENOUS | Status: AC
  Filled 2017-08-20: qty 2

## 2017-08-20 MED ORDER — FERROUS SULFATE 325 (65 FE) MG PO TABS
325.0000 mg | ORAL_TABLET | Freq: Every day | ORAL | Status: DC
  Administered 2017-08-21: 09:00:00 325 mg via ORAL
  Filled 2017-08-20: qty 1

## 2017-08-20 MED ORDER — NITROGLYCERIN 1 MG/10 ML FOR IR/CATH LAB
INTRA_ARTERIAL | Status: AC
  Filled 2017-08-20: qty 10

## 2017-08-20 MED ORDER — MIDAZOLAM HCL 2 MG/2ML IJ SOLN
INTRAMUSCULAR | Status: DC | PRN
  Administered 2017-08-20: 1 mg via INTRAVENOUS

## 2017-08-20 MED ORDER — LIDOCAINE HCL (PF) 1 % IJ SOLN
INTRAMUSCULAR | Status: DC | PRN
  Administered 2017-08-20 (×2): 2 mL via INTRADERMAL

## 2017-08-20 MED ORDER — CLOPIDOGREL BISULFATE 75 MG PO TABS
75.0000 mg | ORAL_TABLET | Freq: Once | ORAL | Status: AC
  Administered 2017-08-20: 75 mg via ORAL

## 2017-08-20 MED ORDER — FENTANYL CITRATE (PF) 100 MCG/2ML IJ SOLN
INTRAMUSCULAR | Status: AC
  Filled 2017-08-20: qty 2

## 2017-08-20 MED ORDER — HEPARIN (PORCINE) IN NACL 2-0.9 UNITS/ML
INTRAMUSCULAR | Status: AC | PRN
  Administered 2017-08-20 (×2): 500 mL

## 2017-08-20 MED ORDER — CLOPIDOGREL BISULFATE 300 MG PO TABS
ORAL_TABLET | ORAL | Status: AC
  Filled 2017-08-20: qty 1

## 2017-08-20 MED ORDER — ANGIOPLASTY BOOK
Freq: Once | Status: AC
  Administered 2017-08-21: 06:00:00
  Filled 2017-08-20: qty 1

## 2017-08-20 MED ORDER — VERAPAMIL HCL 2.5 MG/ML IV SOLN
INTRAVENOUS | Status: DC | PRN
  Administered 2017-08-20: 10 mL via INTRA_ARTERIAL

## 2017-08-20 MED ORDER — IOHEXOL 350 MG/ML SOLN
INTRAVENOUS | Status: DC | PRN
  Administered 2017-08-20: 140 mL via INTRA_ARTERIAL

## 2017-08-20 MED ORDER — CLOPIDOGREL BISULFATE 300 MG PO TABS
ORAL_TABLET | ORAL | Status: DC | PRN
  Administered 2017-08-20: 300 mg via ORAL

## 2017-08-20 MED ORDER — PRAVASTATIN SODIUM 40 MG PO TABS
40.0000 mg | ORAL_TABLET | Freq: Every day | ORAL | Status: DC
  Administered 2017-08-21: 10:00:00 40 mg via ORAL
  Filled 2017-08-20: qty 1

## 2017-08-20 MED ORDER — ASPIRIN 81 MG PO CHEW
81.0000 mg | CHEWABLE_TABLET | Freq: Every day | ORAL | Status: DC
  Administered 2017-08-21: 10:00:00 81 mg via ORAL
  Filled 2017-08-20: qty 1

## 2017-08-20 MED ORDER — HEPARIN SODIUM (PORCINE) 1000 UNIT/ML IJ SOLN
INTRAMUSCULAR | Status: AC
  Filled 2017-08-20: qty 1

## 2017-08-20 MED ORDER — HYDROCHLOROTHIAZIDE 12.5 MG PO CAPS
12.5000 mg | ORAL_CAPSULE | Freq: Every day | ORAL | Status: DC
  Administered 2017-08-21: 10:00:00 12.5 mg via ORAL
  Filled 2017-08-20: qty 1

## 2017-08-20 SURGICAL SUPPLY — 18 items
BALLN SAPPHIRE 2.0X12 (BALLOONS) ×2
BALLOON SAPPHIRE 2.0X12 (BALLOONS) IMPLANT
CATH BALLN WEDGE 5F 110CM (CATHETERS) ×1 IMPLANT
CATH INFINITI 5FR MULTPACK ANG (CATHETERS) ×1 IMPLANT
CATH LAUNCHER 6FR EBU3.5 (CATHETERS) ×1 IMPLANT
DEVICE RAD COMP TR BAND LRG (VASCULAR PRODUCTS) ×1 IMPLANT
GLIDESHEATH SLEND SS 6F .021 (SHEATH) ×1 IMPLANT
GUIDEWIRE INQWIRE 1.5J.035X260 (WIRE) IMPLANT
INQWIRE 1.5J .035X260CM (WIRE) ×2
KIT ENCORE 26 ADVANTAGE (KITS) ×1 IMPLANT
KIT HEART LEFT (KITS) ×2 IMPLANT
PACK CARDIAC CATHETERIZATION (CUSTOM PROCEDURE TRAY) ×2 IMPLANT
SHEATH GLIDE SLENDER 4/5FR (SHEATH) ×1 IMPLANT
STENT SYNERGY DES 2.5X20 (Permanent Stent) ×1 IMPLANT
TRANSDUCER W/STOPCOCK (MISCELLANEOUS) ×2 IMPLANT
TUBING CIL FLEX 10 FLL-RA (TUBING) ×2 IMPLANT
WIRE COUGAR XT STRL 190CM (WIRE) ×1 IMPLANT
WIRE HI TORQ VERSACORE-J 145CM (WIRE) ×1 IMPLANT

## 2017-08-20 NOTE — Interval H&P Note (Signed)
History and Physical Interval Note:  08/20/2017 11:05 AM  John Sheppard.  has presented today for cardiac cath with the diagnosis of dyspnea, obstructive CAD by coronary CTA/FFR. The various methods of treatment have been discussed with the patient and family. After consideration of risks, benefits and other options for treatment, the patient has consented to  Procedure(s): RIGHT/LEFT HEART CATH AND CORONARY ANGIOGRAPHY (N/A) as a surgical intervention .  The patient's history has been reviewed, patient examined, no change in status, stable for surgery.  I have reviewed the patient's chart and labs.  Questions were answered to the patient's satisfaction.    Cath Lab Visit (complete for each Cath Lab visit)  Clinical Evaluation Leading to the Procedure:   ACS: No.  Non-ACS:    Anginal Classification: CCS II  Anti-ischemic medical therapy: Minimal Therapy (1 class of medications)  Non-Invasive Test Results: No non-invasive testing performed  Prior CABG: No previous CABG       [name]

## 2017-08-20 NOTE — Progress Notes (Signed)
TRB  BAND REMOVAL  LOCATION:    left radial  DEFLATED PER PROTOCOL:    Yes.    TIME BAND OFF / DRESSING APPLIED:    1645   SITE UPON ARRIVAL:    Level 0  SITE AFTER BAND REMOVAL:    Level 0  CIRCULATION SENSATION AND MOVEMENT:    Within Normal Limits   Yes.    COMMENTS:   Tolerated procedure well

## 2017-08-21 ENCOUNTER — Telehealth: Payer: Self-pay | Admitting: Adult Health

## 2017-08-21 DIAGNOSIS — I10 Essential (primary) hypertension: Secondary | ICD-10-CM | POA: Diagnosis not present

## 2017-08-21 DIAGNOSIS — I480 Paroxysmal atrial fibrillation: Secondary | ICD-10-CM | POA: Diagnosis not present

## 2017-08-21 DIAGNOSIS — I4892 Unspecified atrial flutter: Secondary | ICD-10-CM | POA: Diagnosis not present

## 2017-08-21 DIAGNOSIS — Z79899 Other long term (current) drug therapy: Secondary | ICD-10-CM | POA: Diagnosis not present

## 2017-08-21 DIAGNOSIS — I25119 Atherosclerotic heart disease of native coronary artery with unspecified angina pectoris: Secondary | ICD-10-CM | POA: Diagnosis not present

## 2017-08-21 DIAGNOSIS — R0602 Shortness of breath: Secondary | ICD-10-CM | POA: Diagnosis not present

## 2017-08-21 DIAGNOSIS — E0590 Thyrotoxicosis, unspecified without thyrotoxic crisis or storm: Secondary | ICD-10-CM | POA: Diagnosis not present

## 2017-08-21 DIAGNOSIS — H3530 Unspecified macular degeneration: Secondary | ICD-10-CM | POA: Diagnosis not present

## 2017-08-21 DIAGNOSIS — Z7901 Long term (current) use of anticoagulants: Secondary | ICD-10-CM | POA: Diagnosis not present

## 2017-08-21 LAB — BASIC METABOLIC PANEL
Anion gap: 6 (ref 5–15)
BUN: 10 mg/dL (ref 8–23)
CHLORIDE: 106 mmol/L (ref 98–111)
CO2: 24 mmol/L (ref 22–32)
Calcium: 8.2 mg/dL — ABNORMAL LOW (ref 8.9–10.3)
Creatinine, Ser: 0.82 mg/dL (ref 0.61–1.24)
GFR calc Af Amer: >60 mL/min (ref >60)
GFR calc non Af Amer: >60 mL/min (ref >60)
Glucose, Bld: 97 mg/dL (ref 70–99)
POTASSIUM: 4.1 mmol/L (ref 3.5–5.1)
SODIUM: 136 mmol/L (ref 135–145)

## 2017-08-21 LAB — LIPID PANEL
Cholesterol: 139 mg/dL (ref 0–200)
HDL: 36 mg/dL — ABNORMAL LOW
LDL Cholesterol: 83 mg/dL (ref 0–99)
Total CHOL/HDL Ratio: 3.9 ratio
Triglycerides: 98 mg/dL
VLDL: 20 mg/dL (ref 0–40)

## 2017-08-21 LAB — CBC
HCT: 33.8 % — ABNORMAL LOW (ref 39.0–52.0)
Hemoglobin: 11.2 g/dL — ABNORMAL LOW (ref 13.0–17.0)
MCH: 32.5 pg (ref 26.0–34.0)
MCHC: 33.1 g/dL (ref 30.0–36.0)
MCV: 98 fL (ref 78.0–100.0)
Platelets: 150 K/uL (ref 150–400)
RBC: 3.45 MIL/uL — ABNORMAL LOW (ref 4.22–5.81)
RDW: 13.3 % (ref 11.5–15.5)
WBC: 7.7 K/uL (ref 4.0–10.5)

## 2017-08-21 MED ORDER — CLOPIDOGREL BISULFATE 75 MG PO TABS: 75.0000 mg | ORAL_TABLET | Freq: Every day | ORAL | 3 refills | Status: DC

## 2017-08-21 MED ORDER — NITROGLYCERIN 0.4 MG SL SUBL: 0.4000 mg | SUBLINGUAL_TABLET | SUBLINGUAL | 3 refills | Status: AC | PRN

## 2017-08-21 MED ORDER — ASPIRIN 81 MG PO CHEW: 81.0000 mg | CHEWABLE_TABLET | Freq: Every day | ORAL | 3 refills | Status: DC

## 2017-08-21 MED FILL — Heparin Sod (Porcine)-NaCl IV Soln 1000 Unit/500ML-0.9%: INTRAVENOUS | Qty: 1000 | Status: AC

## 2017-08-21 NOTE — Telephone Encounter (Signed)
Pt has TOC appt w/Katheryn Purcell Nails 7.22.19 @9 :30 per [facility].

## 2017-08-21 NOTE — Telephone Encounter (Signed)
Patient discharged today. TOC call tomorrow 08/21/17

## 2017-08-21 NOTE — Discharge Instructions (Signed)

## 2017-08-21 NOTE — Progress Notes (Signed)
CARDIAC REHAB PHASE I   PRE:  Rate/Rhythm: 74 SR  BP:  Sitting: 127/60      SaO2: 98 RA  MODE:  Ambulation: 400 ft   POST:  Rate/Rhythm: 70 SR  BP:  Sitting: 134/80    SaO2: 99 RA   Pt ambulated 475ft in hallway independently with steady gait. Pt and wife educated on importance of Plavix, ASA, statin, and NTG. Pt given heart healthy diet. Reviewed restrictions and exercise guidelines. Encouraged pt to take it easy over next week or so, and hold off on golfing. Will refer to CRP II [place] with knowledge pt is not interested at this time.  [id number] Rufina Falco, RN BSN 08/21/2017 8:56 AM

## 2017-08-21 NOTE — Discharge Summary (Signed)
Discharge Summary    Patient ID: John Sheppard.,  MRN: [id number], DOB/AGE: 07-08-1940 77 y.o.  Admit date: 08/20/2017 Discharge date: 08/21/2017  Primary Care Provider: [name] Primary Cardiologist: [name]  Discharge Diagnoses    Active Problems:   ATRIAL FIBRILLATION   Paroxysmal atrial fibrillation [name])   Chronic anticoagulation   Coronary artery disease involving native coronary artery of native heart with angina pectoris [name])   Essential hypertension   Allergies No Known Allergies  Diagnostic Studies/Procedures    Right/Left Heart Catheterization 08/20/17: Conclusion     Prox RCA to Mid RCA lesion is 20% stenosed.  Ost RPDA to RPDA lesion is 20% stenosed.  Ost Cx to Prox Cx lesion is 20% stenosed.  Prox LAD lesion is 80% stenosed.  Mid LAD lesion is 40% stenosed.  A drug-eluting stent was successfully placed using a STENT SYNERGY DES 2.5X20.  Post intervention, there is a 0% residual stenosis.  LV end diastolic pressure is normal.   1. Severe stenosis mid LAD (proven to be flow limiting by CT FFR).  2. Mild non-obstructive disease in the RCA, Circumflex and distal LAD 3. Successful PTCA/DES x 1 mid LAD 4. Normal filling pressures  Recommendations: Monitor overnight. Likely d/c home in am. Recommend uninterrupted dual antiplatelet therapy with Aspirin 81mg  daily and Clopidogrel 75mg  daily for a minimum of 6 months (stable ischemic heart disease - Class I recommendation).    _____________   History of Present Illness     77 y.o. Male with PMH of HTN, atrial flutter/fibrillation, macular degeneration, and hyperthyroidism who presented for outpatient cardiac catheterization. He underwent a coronary CTA 07/2017 which revealed hemodynamically significant stenosis in the proximal LAD after D1 which prompted the referral for cardiac cath.   Hospital Course     Consultants: None   1. CAD: patient was found to have significant  stenosis of his proximal LAD on coronary CTA. He presented for [facility] 08/20/17 which revealed 80% stenosis of the proximal LAD managed with PCI/DES; also with non-obstructive disease in the RCA, Cx, and distal LAD. He was recommended to continue DAPT with ASA and plavix for at least 6 months. Patient tolerated the procedure well. Cath site was without significant ecchymosis or hematoma. Cr and hemoglobin stable post cath - Continue ASA, plavix, and statin  2. HTN: BP stable - Continue HCTZ and lisinopril  3. Atrial fibrillation/flutter: maintaining sinus rhythm this admission - Continue sotalol  4. HLD: high-intensity statins not covered by patients insurance - Continue pravastatin   Patient has remotely followed with [name]. Will reconnect him for general cardiology care.   _____________  Discharge Vitals Blood pressure 104/69, pulse (!) 54, temperature 98.1 F (36.7 C), resp. rate 16, height 6' (1.829 m), weight 182 lb 14.4 oz (83 kg), SpO2 97 %.  Filed Weights   08/20/17 0907 08/21/17 0628  Weight: 181 lb (82.1 kg) 182 lb 14.4 oz (83 kg)   Physical exam on the day of discharge:  NFA:OZHYQM in bed in no acute distress.   Neck:No JVD, no carotid bruits Cardiac: RRR, no murmurs, rubs, or gallops. L wrist cath site C/D/I Respiratory:Clear to auscultation bilaterally, no wheeze/rales/ rhonchi VH:QION, Soft, nontender, non-distended  MS:No edema; No deformity. Neuro:Nonfocal, moving all extremities spontaneously Psych: Normal affect    Labs & Radiologic Studies    CBC Recent Labs    08/21/17 0215  WBC 7.7  HGB 11.2*  HCT 33.8*  MCV 98.0  PLT 150  Basic Metabolic Panel Recent Labs    08/21/17 0215  NA 136  K 4.1  CL 106  CO2 24  GLUCOSE 97  BUN 10  CREATININE 0.82  CALCIUM 8.2*   Liver Function Tests No results for input(s): AST, ALT, ALKPHOS, BILITOT, PROT, ALBUMIN in the last 72 hours. No results for input(s): LIPASE, AMYLASE in the last 72  hours. Cardiac Enzymes No results for input(s): CKTOTAL, CKMB, CKMBINDEX, TROPONINI in the last 72 hours. BNP Invalid input(s): POCBNP D-Dimer No results for input(s): DDIMER in the last 72 hours. Hemoglobin A1C No results for input(s): HGBA1C in the last 72 hours. Fasting Lipid Panel No results for input(s): CHOL, HDL, LDLCALC, TRIG, CHOLHDL, LDLDIRECT in the last 72 hours. Thyroid Function Tests No results for input(s): TSH, T4TOTAL, T3FREE, THYROIDAB in the last 72 hours.  Invalid input(s): FREET3 _____________  Ct Coronary Morph W/cta Cor W/score W/ca W/cm &/or Wo/cm  Addendum Date: 08/02/2017   ADDENDUM REPORT: 08/02/2017 00:10 CLINICAL DATA:  Chest pain EXAM: CT FFR MEDICATIONS: No additional medications. TECHNIQUE: The coronary CT was sent for FFR. FINDINGS: FFR 0.71 in the mid LAD and 0.65 in the distal LAD. FFR 0.78 in the distal LCx. IMPRESSION: 1. Hemodynamically significant stenosis in the proximal LAD after D1. No hemodynamically significant disease in D1. 2. Significantly low FFR in the distal LCx. No definite discrete stenosis identified, may be the cumulative effect of upstream plaque. [name] Electronically Signed   By: [name] M.D.   On: 08/02/2017 00:10   Addendum Date: 08/01/2017   ADDENDUM REPORT: 08/01/2017 06:59 CLINICAL DATA:  Chest pain EXAM: Cardiac CTA MEDICATIONS: Sub lingual nitro. 4mg  x 2 TECHNIQUE: The patient was scanned on a Siemens 035 slice scanner. Gantry rotation speed was 250 msecs. Collimation was 0.6 mm. A 100 kV prospective scan was triggered in the ascending thoracic aorta at 35-75% of the R-R interval. Average HR during the scan was 60 bpm. The 3D data set was interpreted on a dedicated work station using MPR, MIP and VRT modes. A total of 80cc of contrast was used. FINDINGS: Non-cardiac: See separate report from [facility]. Calcium Score: 460 Agatston units. Coronary Arteries: Right dominant with no anomalies LM: Calcified plaque  distal left main, no more than mild stenosis. LAD system: Moderate D1 with ostial/proximal mixed plaque. Possible up to moderate stenosis. There is extensive mixed plaque throughout the proximal LAD. Possible moderate stenosis. Circumflex system: Small-moderate ramus with calcified plaque proximally, probably mild stenosis. No significant plaque in the AV LCx. RCA system: Calcified plaque proximal RCA without significant stenosis. Calcified plaque distal RCA near bifurcation with mild stenosis. IMPRESSION: 1. Coronary artery calcium score 460 Agatston units. This places the patient in the 61st percentile for age and gender, suggesting intermediate risk for future cardiac events. 2. Possible moderate stenosis in the proximal LAD and the proximal D1. Will send study for FFR. [name] Electronically Signed   By: [name] M.D.   On: 08/01/2017 06:59   Result Date: 08/02/2017 EXAM: OVER-READ INTERPRETATION  CT CHEST The following report is an over-read performed by radiologist Dr. Vinnie Langton of [facility], [place] on 07/31/2017. This over-read does not include interpretation of cardiac or coronary anatomy or pathology. The coronary calcium score/coronary CTA interpretation by the cardiologist is attached. COMPARISON:  None. FINDINGS: Noncalcified atheromatous plaque in the ascending thoracic aorta. Numerous densely calcified mediastinal and bilateral hilar lymph nodes are incidentally noted. Tiny calcified granuloma in the right lower lobe. 4 mm  subpleural nodule in the periphery of the right lower lobe (axial image 26 of series 12). Within the visualized portions of the thorax there are no other larger more suspicious appearing pulmonary nodules or masses, there is no acute consolidative airspace disease, no pleural effusions, no pneumothorax and no lymphadenopathy. Visualized portions of the upper abdomen are unremarkable. There are no aggressive appearing lytic or blastic lesions noted in the  visualized portions of the skeleton. IMPRESSION: 1. 4 mm subpleural nodule in the periphery of the right lower lobe, nonspecific but statistically likely to represent a subpleural lymph node. No follow-up needed if patient is low-risk. Non-contrast chest CT can be considered in 12 months if patient is high-risk. This recommendation follows the consensus statement: Guidelines for Management of Incidental Pulmonary Nodules Detected on CT Images: From the Fleischner Society 2017; Radiology 2017; 284:228-243. 2. Aortic Atherosclerosis (ICD10-I70.0). Electronically Signed: By: Vinnie Langton M.D. On: 07/31/2017 12:10   Ct Coronary Fractional Flow Reserve Data Prep  Addendum Date: 08/18/2017   ADDENDUM REPORT: 08/02/2017 00:10 CLINICAL DATA:  Chest pain EXAM: CT FFR MEDICATIONS: No additional medications. TECHNIQUE: The coronary CT was sent for FFR. FINDINGS: FFR 0.71 in the mid LAD and 0.65 in the distal LAD. FFR 0.78 in the distal LCx. IMPRESSION: 1. Hemodynamically significant stenosis in the proximal LAD after D1. No hemodynamically significant disease in D1. 2. Significantly low FFR in the distal LCx. No definite discrete stenosis identified, may be the cumulative effect of upstream plaque. [name] Electronically Signed   By: [name] M.D.   On: 08/02/2017 00:10   Result Date: 08/02/2017 EXAM: OVER-READ INTERPRETATION  CT CHEST The following report is an over-read performed by radiologist Dr. Vinnie Langton of [facility], [place] on 07/31/2017. This over-read does not include interpretation of cardiac or coronary anatomy or pathology. The coronary calcium score/coronary CTA interpretation by the cardiologist is attached. COMPARISON:  None. FINDINGS: Noncalcified atheromatous plaque in the ascending thoracic aorta. Numerous densely calcified mediastinal and bilateral hilar lymph nodes are incidentally noted. Tiny calcified granuloma in the right lower lobe. 4 mm subpleural nodule in the  periphery of the right lower lobe (axial image 26 of series 12). Within the visualized portions of the thorax there are no other larger more suspicious appearing pulmonary nodules or masses, there is no acute consolidative airspace disease, no pleural effusions, no pneumothorax and no lymphadenopathy. Visualized portions of the upper abdomen are unremarkable. There are no aggressive appearing lytic or blastic lesions noted in the visualized portions of the skeleton. IMPRESSION: 1. 4 mm subpleural nodule in the periphery of the right lower lobe, nonspecific but statistically likely to represent a subpleural lymph node. No follow-up needed if patient is low-risk. Non-contrast chest CT can be considered in 12 months if patient is high-risk. This recommendation follows the consensus statement: Guidelines for Management of Incidental Pulmonary Nodules Detected on CT Images: From the Fleischner Society 2017; Radiology 2017; 284:228-243. 2. Aortic Atherosclerosis (ICD10-I70.0). Electronically Signed: By: Vinnie Langton M.D. On: 07/31/2017 12:10   Ct Coronary Fractional Flow Reserve Fluid Analysis  Addendum Date: 08/18/2017   ADDENDUM REPORT: 08/02/2017 00:10 CLINICAL DATA:  Chest pain EXAM: CT FFR MEDICATIONS: No additional medications. TECHNIQUE: The coronary CT was sent for FFR. FINDINGS: FFR 0.71 in the mid LAD and 0.65 in the distal LAD. FFR 0.78 in the distal LCx. IMPRESSION: 1. Hemodynamically significant stenosis in the proximal LAD after D1. No hemodynamically significant disease in D1. 2. Significantly low FFR in the distal LCx.  No definite discrete stenosis identified, may be the cumulative effect of upstream plaque. [name] Electronically Signed   By: [name] M.D.   On: 08/02/2017 00:10   Result Date: 08/02/2017 EXAM: OVER-READ INTERPRETATION  CT CHEST The following report is an over-read performed by radiologist Dr. Vinnie Langton of [facility], [place] on 07/31/2017. This over-read  does not include interpretation of cardiac or coronary anatomy or pathology. The coronary calcium score/coronary CTA interpretation by the cardiologist is attached. COMPARISON:  None. FINDINGS: Noncalcified atheromatous plaque in the ascending thoracic aorta. Numerous densely calcified mediastinal and bilateral hilar lymph nodes are incidentally noted. Tiny calcified granuloma in the right lower lobe. 4 mm subpleural nodule in the periphery of the right lower lobe (axial image 26 of series 12). Within the visualized portions of the thorax there are no other larger more suspicious appearing pulmonary nodules or masses, there is no acute consolidative airspace disease, no pleural effusions, no pneumothorax and no lymphadenopathy. Visualized portions of the upper abdomen are unremarkable. There are no aggressive appearing lytic or blastic lesions noted in the visualized portions of the skeleton. IMPRESSION: 1. 4 mm subpleural nodule in the periphery of the right lower lobe, nonspecific but statistically likely to represent a subpleural lymph node. No follow-up needed if patient is low-risk. Non-contrast chest CT can be considered in 12 months if patient is high-risk. This recommendation follows the consensus statement: Guidelines for Management of Incidental Pulmonary Nodules Detected on CT Images: From the Fleischner Society 2017; Radiology 2017; 284:228-243. 2. Aortic Atherosclerosis (ICD10-I70.0). Electronically Signed: By: Vinnie Langton M.D. On: 07/31/2017 12:10   Disposition   Patient was seen and examined by Dr. Oval Linsey who deemed patient as stable for discharge. Follow-up has been arranged. Discharge medications as listed below.   Follow-up Plans & Appointments       Discharge Medications   Allergies as of 08/21/2017   No Known Allergies     Medication List    TAKE these medications   aspirin 81 MG chewable tablet Chew 1 tablet (81 mg total) by mouth daily.   clopidogrel 75 MG  tablet Commonly known as:  PLAVIX Take 1 tablet (75 mg total) by mouth daily.   ferrous sulfate 325 (65 FE) MG tablet Take 325 mg by mouth daily with breakfast.   Fish Oil 1000 MG Cpdr Take 1 capsule by mouth daily.   hydrochlorothiazide 12.5 MG capsule Commonly known as:  MICROZIDE Take 1 capsule (12.5 mg total) by mouth daily.   lisinopril 10 MG tablet Commonly known as:  PRINIVIL,ZESTRIL Take 1 tablet (10 mg total) by mouth daily.   multivitamin tablet Take 1 tablet by mouth daily.   pravastatin 40 MG tablet Commonly known as:  PRAVACHOL Take 40 mg by mouth daily.   PRESERVISION/LUTEIN PO Take 1 capsule by mouth 2 (two) times daily.   sotalol 160 MG tablet Commonly known as:  BETAPACE Take 1 tablet (160 mg total) by mouth 2 (two) times daily.        Aspirin prescribed at discharge?  Yes High Intensity Statin Prescribed? (Lipitor 40-80mg  or Crestor 20-40mg ): Yes Beta Blocker Prescribed? No: bradycardic at baseline For EF <40%, was ACEI/ARB Prescribed? Yes ADP Receptor Inhibitor Prescribed? (i.e. Plavix etc.-Includes Medically Managed Patients): Yes For EF <40%, Aldosterone Inhibitor Prescribed? No: EF >40% Was EF assessed during THIS hospitalization? No: assessed prior to admission Was Cardiac Rehab II ordered? (Included Medically managed Patients): Yes   Outstanding Labs/Studies   None  Duration of Discharge  Encounter   Greater than 30 minutes including physician time.  Signed, Abigail Butts PA-C 08/21/2017, 8:34 AM

## 2017-08-22 NOTE — Telephone Encounter (Signed)
Patient contacted regarding discharge from [facility] on 08/21/17.    Patient understands to follow up with provider K. Lawrence DNP on 09/01/17 at 9:30AM at [place].  Patient understands discharge instructions? yes  Patient understands medications and regiment? yes  Patient understands to bring all medications to this visit? yes

## 2017-08-31 NOTE — Progress Notes (Signed)
Cardiology Office Note   Date:  09/01/2017   ID:  John Sheppard., DOB [birth year], MRN [id number]  PCP:  [name], MD  Cardiologist: [name]   Chief Complaint  Patient presents with  . Hospitalization Follow-up    stent follow up, denies chest pains, SOB, swelling     History of Present Illness: John Sheppard. is a 77 y.o. male who presents for posthospitalization follow-up with known history of paroxysmal atrial fibrillation, hypertension, and hyperthyroidism.  The patient had an abnormal CTA on 07/2017 that revealed significant LAD disease.  Patient had cardiac catheterization on 08/20/2017 which demonstrated 8% proximal LAD stenosis and otherwise mild nonobstructive disease in the right coronary artery and left circumflex.  The patient ultimately had a Synergy drug-eluting stent placed in the LAD without complication.  The patient was placed on dual antiplatelet therapy with aspirin and Plavix which she will require to take for a minimum of 6 months.  He was continued on statin therapy.  The patient refused anticoagulation therapy in the past in the setting of PAF.  He is doing well post procedure. He is medically compliant and offers no new complaints.   Past Medical History:  Diagnosis Date  . Atrial flutter ([place])    s/p CTI ablation 10/20/09  . Chronic lower back pain   . Coronary artery disease   . Dry age-related macular degeneration of right eye   . Hay fever   . High cholesterol   . Hypertension   . Hyperthyroidism    "never treated for this" (08/20/2017)  . Paroxysmal atrial fibrillation ([place])    s/p PVI 10/20/09, 10/19/10  . Pneumonia 1992    Past Surgical History:  Procedure Laterality Date  . ATRIAL ABLATION SURGERY  10/20/2009; 10/19/2010   PVI and CTI ablation by JA   . CARDIOVERSION  2001  . CATARACT EXTRACTION W/ INTRAOCULAR LENS  IMPLANT, BILATERAL Bilateral 08/2010  . COLONOSCOPY     "~ 6 month after 1st colonoscopy; no polyps 2nd time"  .  COLONOSCOPY W/ BIOPSIES AND POLYPECTOMY    . CORONARY STENT INTERVENTION N/A 08/20/2017   Procedure: CORONARY STENT INTERVENTION;  Surgeon: [name], MD;  Location: [place] [facility];  Service: Cardiovascular;  Laterality: N/A;  . RIGHT/LEFT HEART CATH AND CORONARY ANGIOGRAPHY N/A 08/20/2017   Procedure: RIGHT/LEFT HEART CATH AND CORONARY ANGIOGRAPHY;  Surgeon: [name], MD;  Location: [place] [facility];  Service: Cardiovascular;  Laterality: N/A;  . TRANSESOPHAGEAL ECHOCARDIOGRAM  10/2009   EF: 60-65%     Current Outpatient Medications  Medication Sig Dispense Refill  . aspirin 81 MG chewable tablet Chew 1 tablet (81 mg total) by mouth daily. 90 tablet 3  . clopidogrel (PLAVIX) 75 MG tablet Take 1 tablet (75 mg total) by mouth daily. 90 tablet 3  . ferrous sulfate 325 (65 FE) MG tablet Take 325 mg by mouth daily with breakfast.    . hydrochlorothiazide (MICROZIDE) 12.5 MG capsule Take 1 capsule (12.5 mg total) by mouth daily. 90 capsule 3  . lisinopril (PRINIVIL,ZESTRIL) 10 MG tablet Take 1 tablet (10 mg total) by mouth daily. 90 tablet 3  . Multiple Vitamin (MULTIVITAMIN) tablet Take 1 tablet by mouth daily.      . Multiple Vitamins-Minerals (PRESERVISION/LUTEIN PO) Take 1 capsule by mouth 2 (two) times daily.     . nitroGLYCERIN (NITROSTAT) 0.4 MG SL tablet Place 1 tablet (0.4 mg total) under the tongue every 5 (five) minutes as needed for chest  pain. 25 tablet 3  . Omega-3 Fatty Acids (FISH OIL) 1000 MG CPDR Take 1 capsule by mouth daily.     . pravastatin (PRAVACHOL) 40 MG tablet Take 40 mg by mouth daily.     . sotalol (BETAPACE) 160 MG tablet Take 1 tablet (160 mg total) by mouth 2 (two) times daily. 180 tablet 3   No current facility-administered medications for this visit.     Allergies:   Patient has no known allergies.    Social History:  The patient  reports that he has never smoked. He has never used smokeless tobacco. He reports that he drinks  about 1.2 oz of alcohol per week. He reports that he does not use drugs.   Family History:  The patient's family history includes Heart failure in his father.    ROS: All other systems are reviewed and negative. Unless otherwise mentioned in H&P    PHYSICAL EXAM: VS:  BP 132/78 (BP Location: Left Arm, Patient Position: Sitting)   Pulse (!) 54   Ht 6' (1.829 m)   Wt 187 lb 6.4 oz (85 kg)   BMI 25.42 kg/m  , BMI Body mass index is 25.42 kg/m. GEN: Well nourished, well developed, in no acute distress  HEENT: normal  Neck: no JVD, carotid bruits, or masses Cardiac: RRR; no murmurs, rubs, or gallops,no edema  Respiratory:  clear to auscultation bilaterally, normal work of breathing GI: soft, nontender, nondistended, + BS MS: no deformity or atrophy Ecchymosis on left arm proximal to cath insertion site.  Skin: warm and dry, no rash Neuro:  Strength and sensation are intact Psych: euthymic mood, full affect   EKG: Sinus bradycardia rate of 54 bpm.    Recent Labs: 06/17/2017: B Natriuretic Peptide 97.5 08/21/2017: BUN 10; Creatinine, Ser 0.82; Hemoglobin 11.2; Platelets 150; Potassium 4.1; Sodium 136    Lipid Panel    Component Value Date/Time   CHOL 139 08/21/2017 0215   TRIG 98 08/21/2017 0215   HDL 36 (L) 08/21/2017 0215   CHOLHDL 3.9 08/21/2017 0215   VLDL 20 08/21/2017 0215   LDLCALC 83 08/21/2017 0215      Wt Readings from Last 3 Encounters:  09/01/17 187 lb 6.4 oz (85 kg)  08/21/17 182 lb 14.4 oz (83 kg)  08/11/17 184 lb (83.5 kg)      Other studies Reviewed: Right/Left Heart Catheterization 08/20/17: Conclusion     Prox RCA to Mid RCA lesion is 20% stenosed.  Ost RPDA to RPDA lesion is 20% stenosed.  Ost Cx to Prox Cx lesion is 20% stenosed.  Prox LAD lesion is 80% stenosed.  Mid LAD lesion is 40% stenosed.  A drug-eluting stent was successfully placed using a STENT SYNERGY DES 2.5X20.  Post intervention, there is a 0% residual stenosis.  LV  end diastolic pressure is normal.  1. Severe stenosis mid LAD (proven to be flow limiting by CT FFR).  2. Mild non-obstructive disease in the RCA, Circumflex and distal LAD 3. Successful PTCA/DES x 1 mid LAD 4. Normal filling pressures  Recommendations: Monitor overnight. Likely d/c home in am. Recommend uninterrupted dual antiplatelet therapy with Aspirin 81mg  daily and Clopidogrel 75mg  dailyfor a minimum of 6 months (stable ischemic heart disease - Class I recommendation      ASSESSMENT AND PLAN:  1.  CAD: Status post cardiac catheterization on 08/20/2017 with 80% proximal LAD stenosis requiring DES. He remains on DAPT and is without complains of bleeding. He has some bruising noted on the  left forearm cath insertion site which is dissipating.  I have gone over his cardiac catheterization illustration and pointed out where stent is placed.  I have answered multiple questions.  He verbalizes understanding.  2.  Hypertension: Blood pressure is well controlled.  No change in his current regimen.   3.  PAF: Patient denies any irregular heart rhythm or palpitations at this time.  He continues on sotalol.  4.  Hypercholesterolemia: I reviewed his most recent labs.  Goal of LDL is less than 70.  Is currently 77.  We will do follow-up labs in 3 months for reevaluation.  He is to adhere to a low-cholesterol diet continue pravastatin and fish oil.   Current medicines are reviewed at length with the patient today.    Labs/ tests ordered today include: none  Phill Myron. West Pugh, ANP, AACC   09/01/2017 11:57 AM    [facility] [address], [place], [place] [address] Phone: [phone]; Fax: [phone]

## 2017-09-01 ENCOUNTER — Encounter: Payer: Self-pay | Admitting: Adult Health

## 2017-09-01 ENCOUNTER — Ambulatory Visit: Payer: Medicare HMO | Admitting: Adult Health

## 2017-09-01 VITALS — BP 132/78 | HR 54 | Ht 72.0 in | Wt 187.4 lb

## 2017-09-01 DIAGNOSIS — E7800 Pure hypercholesterolemia, unspecified: Secondary | ICD-10-CM | POA: Diagnosis not present

## 2017-09-01 DIAGNOSIS — I2510 Atherosclerotic heart disease of native coronary artery without angina pectoris: Secondary | ICD-10-CM | POA: Diagnosis not present

## 2017-09-01 DIAGNOSIS — I10 Essential (primary) hypertension: Secondary | ICD-10-CM | POA: Diagnosis not present

## 2017-09-01 DIAGNOSIS — I480 Paroxysmal atrial fibrillation: Secondary | ICD-10-CM | POA: Diagnosis not present

## 2017-09-01 NOTE — Patient Instructions (Signed)
Medication Instructions:  NO CHANGES- Your physician recommends that you continue on your current medications as directed. Please refer to the Current Medication list given to you today.  If you need a refill on your cardiac medications before your next appointment, please call your pharmacy.  Follow-Up: Your physician wants you to follow-up in: [place].   Thank you for choosing [facility] at [facility]!!

## 2017-10-06 DIAGNOSIS — H43813 Vitreous degeneration, bilateral: Secondary | ICD-10-CM | POA: Diagnosis not present

## 2017-10-06 DIAGNOSIS — H35371 Puckering of macula, right eye: Secondary | ICD-10-CM | POA: Diagnosis not present

## 2017-10-06 DIAGNOSIS — H35341 Macular cyst, hole, or pseudohole, right eye: Secondary | ICD-10-CM | POA: Diagnosis not present

## 2017-10-06 DIAGNOSIS — H5213 Myopia, bilateral: Secondary | ICD-10-CM | POA: Diagnosis not present

## 2017-11-20 ENCOUNTER — Ambulatory Visit: Payer: Medicare HMO | Admitting: Internal Medicine

## 2017-11-20 ENCOUNTER — Encounter: Payer: Self-pay | Admitting: Internal Medicine

## 2017-11-20 VITALS — BP 140/80 | HR 49 | Ht 72.0 in | Wt 185.8 lb

## 2017-11-20 DIAGNOSIS — Z23 Encounter for immunization: Secondary | ICD-10-CM

## 2017-11-20 DIAGNOSIS — R0602 Shortness of breath: Secondary | ICD-10-CM

## 2017-11-20 DIAGNOSIS — I480 Paroxysmal atrial fibrillation: Secondary | ICD-10-CM | POA: Diagnosis not present

## 2017-11-20 DIAGNOSIS — I10 Essential (primary) hypertension: Secondary | ICD-10-CM | POA: Diagnosis not present

## 2017-11-20 NOTE — Patient Instructions (Addendum)
Medication Instructions:  Your physician has recommended you make the following change in your medication:   1.  Stop taking HCTZ (hydrochlorothiazide)  Labwork: None ordered.  Testing/Procedures: None ordered.  Follow-Up:  Your physician wants you to follow-up in: 3 months with Dr. Angelena Form.  Your physician wants you to follow-up in: 9 months with [name].      Any Other Special Instructions Will Be Listed Below (If Applicable).  If you need a refill on your cardiac medications before your next appointment, please call your pharmacy.

## 2017-11-20 NOTE — Progress Notes (Signed)
PCP: [name], MD   Primary EP: Dr Roanna Epley Raheel Kunkle. is a 77 y.o. male who presents today for routine electrophysiology followup.  Since last being seen in our clinic, the patient reports doing very well.  Today, he denies symptoms of palpitations, chest pain, shortness of breath,  lower extremity edema, dizziness, presyncope, or syncope.  The patient is otherwise without complaint today.   Past Medical History:  Diagnosis Date  . Atrial flutter ([place])    s/p CTI ablation 10/20/09  . Chronic lower back pain   . Coronary artery disease   . Dry age-related macular degeneration of right eye   . Hay fever   . High cholesterol   . Hypertension   . Hyperthyroidism    "never treated for this" (08/20/2017)  . Paroxysmal atrial fibrillation ([place])    s/p PVI 10/20/09, 10/19/10  . Pneumonia 1992   Past Surgical History:  Procedure Laterality Date  . ATRIAL ABLATION SURGERY  10/20/2009; 10/19/2010   PVI and CTI ablation by JA   . CARDIOVERSION  2001  . CATARACT EXTRACTION W/ INTRAOCULAR LENS  IMPLANT, BILATERAL Bilateral 08/2010  . COLONOSCOPY     "~ 6 month after 1st colonoscopy; no polyps 2nd time"  . COLONOSCOPY W/ BIOPSIES AND POLYPECTOMY    . CORONARY STENT INTERVENTION N/A 08/20/2017   Procedure: CORONARY STENT INTERVENTION;  Surgeon: [name], MD;  Location: [place] [facility];  Service: Cardiovascular;  Laterality: N/A;  . RIGHT/LEFT HEART CATH AND CORONARY ANGIOGRAPHY N/A 08/20/2017   Procedure: RIGHT/LEFT HEART CATH AND CORONARY ANGIOGRAPHY;  Surgeon: [name], MD;  Location: [place] [facility];  Service: Cardiovascular;  Laterality: N/A;  . TRANSESOPHAGEAL ECHOCARDIOGRAM  10/2009   EF: 60-65%    ROS- all systems are reviewed and negatives except as per HPI above  Current Outpatient Medications  Medication Sig Dispense Refill  . aspirin 81 MG chewable tablet Chew 1 tablet (81 mg total) by mouth daily. 90 tablet 3  . clopidogrel (PLAVIX)  75 MG tablet Take 1 tablet (75 mg total) by mouth daily. 90 tablet 3  . ferrous sulfate 325 (65 FE) MG tablet Take 325 mg by mouth daily with breakfast.    . Multiple Vitamin (MULTIVITAMIN) tablet Take 1 tablet by mouth daily.      . Multiple Vitamins-Minerals (PRESERVISION/LUTEIN PO) Take 1 capsule by mouth 2 (two) times daily.     . nitroGLYCERIN (NITROSTAT) 0.4 MG SL tablet Place 1 tablet (0.4 mg total) under the tongue every 5 (five) minutes as needed for chest pain. 25 tablet 3  . Omega-3 Fatty Acids (FISH OIL) 1000 MG CPDR Take 1 capsule by mouth daily.     . pravastatin (PRAVACHOL) 40 MG tablet Take 40 mg by mouth daily.     . sotalol (BETAPACE) 160 MG tablet Take 1 tablet (160 mg total) by mouth 2 (two) times daily. 180 tablet 3  . hydrochlorothiazide (MICROZIDE) 12.5 MG capsule Take 1 capsule (12.5 mg total) by mouth daily. 90 capsule 3  . lisinopril (PRINIVIL,ZESTRIL) 10 MG tablet Take 1 tablet (10 mg total) by mouth daily. 90 tablet 3   No current facility-administered medications for this visit.     Physical Exam: Vitals:   11/20/17 1146  BP: 140/80  Pulse: (!) 49  SpO2: 97%  Weight: 185 lb 12.8 oz (84.3 kg)  Height: 6' (1.829 m)    GEN- The patient is well appearing, alert and oriented x 3 today.  Head- normocephalic, atraumatic Eyes-  Sclera clear, conjunctiva pink Ears- hearing intact Oropharynx- clear Lungs- Clear to ausculation bilaterally, normal work of breathing Heart- Regular rate and rhythm, no murmurs, rubs or gallops, PMI not laterally displaced GI- soft, NT, ND, + BS Extremities- no clubbing, cyanosis, or edema  Wt Readings from Last 3 Encounters:  11/20/17 185 lb 12.8 oz (84.3 kg)  09/01/17 187 lb 6.4 oz (85 kg)  08/21/17 182 lb 14.4 oz (83 kg)    EKG tracing ordered today is personally reviewed and shows sinus rhythm with PACs, 49 bpm, otherwise normal ekg  Assessment and Plan:  1. afib Stable with sotalol.  Labs 7/19 reviewed No change  required today chads2vasc score is 4.  Not currently on anticoagulation per patient preference.  If his afib burden increases, hopefully, he will be more willing.  2. HTN He reports good bp control at home He wishes to stop hctz today He will continue to follow bp closely  3. CAD S/p PCI of mid LAD 08/20/17 by Dr Angelena Form He recommends DAPT for at least 6 months I would like for him to see Dr Angelena Form in 3 months so that they can discuss duration of therapy further.  I would prefer to stop DAPT and start [place] at that time, though patient has been resistant to anticoagulation in the past (and again today).  I think Dr Camillia Herter thoughts could be beneficial  4. HL Stable No change required today  5. Bradycardia Chronic asymptomatic  Return in 3 months to see Dr Angelena Form.  I will see in 9 months  Thompson Grayer MD, [facility] 11/20/2017 11:53 AM

## 2018-02-16 ENCOUNTER — Ambulatory Visit: Payer: Medicare HMO | Admitting: Cardiovascular Disease

## 2018-02-16 ENCOUNTER — Encounter (INDEPENDENT_AMBULATORY_CARE_PROVIDER_SITE_OTHER): Payer: Self-pay

## 2018-02-16 ENCOUNTER — Encounter: Payer: Self-pay | Admitting: Cardiovascular Disease

## 2018-02-16 VITALS — BP 128/76 | HR 53 | Ht 72.0 in | Wt 191.0 lb

## 2018-02-16 DIAGNOSIS — I2510 Atherosclerotic heart disease of native coronary artery without angina pectoris: Secondary | ICD-10-CM

## 2018-02-16 NOTE — Progress Notes (Signed)
Chief Complaint  Patient presents with  . Follow-up    CAD     History of Present Illness:77 yo male with history of CAD, atrial flutter and fibrillation s/p ablation, hyperlipidemia, HTN and hyperthyroidism here today for cardiac follow up. He is followed in our office by [name]. I met him in the cath lab in July 2019. He was found to have a severe stenosis in the mid LAD on cardiac CTA. Cardiac cath July 2019 with severe stenosis mid LAD treated with a drug eluting stent. He has been on ASA and Plavix following his PCI and has done well.   He is here today for follow up. The patient denies any chest pain, dyspnea, lower extremity edema, orthopnea, PND, dizziness, near syncope or syncope. He has rare palpitations that last for a few seconds.    Primary Care Physician: [name], MD Primary Cardiologist: [name] Interventional Cardiologist: Angelena Form  Past Medical History:  Diagnosis Date  . Atrial flutter ([place])    s/p CTI ablation 10/20/09  . Chronic lower back pain   . Coronary artery disease   . Dry age-related macular degeneration of right eye   . Hay fever   . High cholesterol   . Hypertension   . Hyperthyroidism    "never treated for this" (08/20/2017)  . Paroxysmal atrial fibrillation ([place])    s/p PVI 10/20/09, 10/19/10  . Pneumonia 1992    Past Surgical History:  Procedure Laterality Date  . ATRIAL ABLATION SURGERY  10/20/2009; 10/19/2010   PVI and CTI ablation by JA   . CARDIOVERSION  2001  . CATARACT EXTRACTION W/ INTRAOCULAR LENS  IMPLANT, BILATERAL Bilateral 08/2010  . COLONOSCOPY     "~ 6 month after 1st colonoscopy; no polyps 2nd time"  . COLONOSCOPY W/ BIOPSIES AND POLYPECTOMY    . CORONARY STENT INTERVENTION N/A 08/20/2017   Procedure: CORONARY STENT INTERVENTION;  Surgeon: [name], MD;  Location: [place] [facility];  Service: Cardiovascular;  Laterality: N/A;  . RIGHT/LEFT HEART CATH AND CORONARY ANGIOGRAPHY N/A 08/20/2017   Procedure:  RIGHT/LEFT HEART CATH AND CORONARY ANGIOGRAPHY;  Surgeon: [name], MD;  Location: [place] [facility];  Service: Cardiovascular;  Laterality: N/A;  . TRANSESOPHAGEAL ECHOCARDIOGRAM  10/2009   EF: 60-65%    Current Outpatient Medications  Medication Sig Dispense Refill  . clopidogrel (PLAVIX) 75 MG tablet Take 1 tablet (75 mg total) by mouth daily. 90 tablet 3  . ferrous sulfate 325 (65 FE) MG tablet Take 325 mg by mouth daily with breakfast.    . lisinopril (PRINIVIL,ZESTRIL) 10 MG tablet Take 10 mg by mouth as needed (Pt only takes when BP is running high).    . Multiple Vitamin (MULTIVITAMIN) tablet Take 1 tablet by mouth daily.      . Multiple Vitamins-Minerals (PRESERVISION/LUTEIN PO) Take 1 capsule by mouth 2 (two) times daily.     . nitroGLYCERIN (NITROSTAT) 0.4 MG SL tablet Place 1 tablet (0.4 mg total) under the tongue every 5 (five) minutes as needed for chest pain. 25 tablet 3  . Omega-3 Fatty Acids (FISH OIL) 1000 MG CPDR Take 1 capsule by mouth daily.     . pravastatin (PRAVACHOL) 40 MG tablet Take 40 mg by mouth daily.     . sotalol (BETAPACE) 160 MG tablet Take 1 tablet (160 mg total) by mouth 2 (two) times daily. 180 tablet 3  . aspirin 81 MG chewable tablet Chew 1 tablet (81 mg total) by mouth  daily. (Patient not taking: Reported on 02/16/2018) 90 tablet 3   No current facility-administered medications for this visit.     No Known Allergies  Social History   Socioeconomic History  . Marital status: Married    Spouse name: [name]  . Number of children: 2  . Years of education: 51  . Highest education level: Not on file  Occupational History  . Occupation: Retired  Scientific laboratory technician  . Financial resource strain: Not on file  . Food insecurity:    Worry: Not on file    Inability: Not on file  . Transportation needs:    Medical: Not on file    Non-medical: Not on file  Tobacco Use  . Smoking status: Never Smoker  . Smokeless tobacco: Never Used    Substance and Sexual Activity  . Alcohol use: Yes    Alcohol/week: 2.0 standard drinks    Types: 2 Cans of beer per week  . Drug use: Never  . Sexual activity: Yes  Lifestyle  . Physical activity:    Days per week: Not on file    Minutes per session: Not on file  . Stress: Not on file  Relationships  . Social connections:    Talks on phone: Not on file    Gets together: Not on file    Attends religious service: Not on file    Active member of club or organization: Not on file    Attends meetings of clubs or organizations: Not on file    Relationship status: Not on file  . Intimate partner violence:    Fear of current or ex partner: Not on file    Emotionally abused: Not on file    Physically abused: Not on file    Forced sexual activity: Not on file  Other Topics Concern  . Not on file  Social History Narrative   Lives w/ wife   Caffeine use:  Drinks tea/diet pepsi daily    Family History  Problem Relation Age of Onset  . Heart failure Father        DECEASED  . Colon cancer Neg Hx   . Stomach cancer Neg Hx     Review of Systems:  As stated in the HPI and otherwise negative.   BP 128/76   Pulse (!) 53   Ht 6' (1.829 m)   Wt 191 lb (86.6 kg)   SpO2 97%   BMI 25.90 kg/m   Physical Examination: General: Well developed, well nourished, NAD  HEENT: OP clear, mucus membranes moist  SKIN: warm, dry. No rashes. Neuro: No focal deficits  Musculoskeletal: Muscle strength 5/5 all ext  Psychiatric: Mood and affect normal  Neck: No JVD, no carotid bruits, no thyromegaly, no lymphadenopathy.  Lungs:Clear bilaterally, no wheezes, rhonci, crackles Cardiovascular: Regular rate and rhythm. No murmurs, gallops or rubs. Abdomen:Soft. Bowel sounds present. Non-tender.  Extremities: No lower extremity edema. Pulses are 2 + in the bilateral DP/PT.  EKG:  EKG is ordered today. The ekg ordered today demonstrates sinus brady, rate 53 bpm  Recent Labs: 06/17/2017: B Natriuretic  Peptide 97.5 08/21/2017: BUN 10; Creatinine, Ser 0.82; Hemoglobin 11.2; Platelets 150; Potassium 4.1; Sodium 136   Lipid Panel    Component Value Date/Time   CHOL 139 08/21/2017 0215   TRIG 98 08/21/2017 0215   HDL 36 (L) 08/21/2017 0215   CHOLHDL 3.9 08/21/2017 0215   VLDL 20 08/21/2017 0215   LDLCALC 83 08/21/2017 0215     Wt Readings from  Last 3 Encounters:  02/16/18 191 lb (86.6 kg)  11/20/17 185 lb 12.8 oz (84.3 kg)  09/01/17 187 lb 6.4 oz (85 kg)     Other studies Reviewed: Additional studies/ records that were reviewed today include:  Review of the above records demonstrates:    Assessment and Plan:   1. CAD without angina: He has done well following the mid LAD stent placement in July 2019. No chest pain. Dyspnea improved. We have discussed stopping Plavix at this time and starting oral anti-coagulation as per previous discussion with [name]. His CHADSVASC score is 4 but he has not wished to consider oral anti-coagulation. He tells me today that he has had only rare palpitations for a few seconds. He does not wish to consider a DOAC at this time. I will continue ASA and Plavix for total of 12 months post PCI. Continue statin. I will see him back in six month. He will continue to follow with [name].   If he has recurrence of atrial fibrillation, I would stop the Plavix and continue ASA along with the DOAC.   Current medicines are reviewed at length with the patient today.  The patient does not have concerns regarding medicines.  The following changes have been made:  no change  Labs/ tests ordered today include:  No orders of the defined types were placed in this encounter.    Disposition:   FU with me in 6 months.    Signed, [name], MD 02/16/2018 10:57 AM    [facility] [place], [place], [place]  [address] Phone: [phone]; Fax: [phone]

## 2018-02-16 NOTE — Patient Instructions (Signed)

## 2018-03-27 DIAGNOSIS — E7849 Other hyperlipidemia: Secondary | ICD-10-CM | POA: Diagnosis not present

## 2018-03-27 DIAGNOSIS — I10 Essential (primary) hypertension: Secondary | ICD-10-CM | POA: Diagnosis not present

## 2018-03-27 DIAGNOSIS — Z125 Encounter for screening for malignant neoplasm of prostate: Secondary | ICD-10-CM | POA: Diagnosis not present

## 2018-03-27 DIAGNOSIS — R82998 Other abnormal findings in urine: Secondary | ICD-10-CM | POA: Diagnosis not present

## 2018-04-03 DIAGNOSIS — Z1339 Encounter for screening examination for other mental health and behavioral disorders: Secondary | ICD-10-CM | POA: Diagnosis not present

## 2018-04-03 DIAGNOSIS — E7849 Other hyperlipidemia: Secondary | ICD-10-CM | POA: Diagnosis not present

## 2018-04-03 DIAGNOSIS — M545 Low back pain: Secondary | ICD-10-CM | POA: Diagnosis not present

## 2018-04-03 DIAGNOSIS — Z0000 Encounter for general adult medical examination without abnormal findings: Secondary | ICD-10-CM | POA: Diagnosis not present

## 2018-04-03 DIAGNOSIS — I10 Essential (primary) hypertension: Secondary | ICD-10-CM | POA: Diagnosis not present

## 2018-04-03 DIAGNOSIS — Z9861 Coronary angioplasty status: Secondary | ICD-10-CM | POA: Diagnosis not present

## 2018-04-03 DIAGNOSIS — I480 Paroxysmal atrial fibrillation: Secondary | ICD-10-CM | POA: Diagnosis not present

## 2018-04-03 DIAGNOSIS — D7589 Other specified diseases of blood and blood-forming organs: Secondary | ICD-10-CM | POA: Diagnosis not present

## 2018-04-03 DIAGNOSIS — Z1331 Encounter for screening for depression: Secondary | ICD-10-CM | POA: Diagnosis not present

## 2018-07-29 ENCOUNTER — Other Ambulatory Visit: Payer: Self-pay | Admitting: Internal Medicine

## 2018-09-17 ENCOUNTER — Other Ambulatory Visit: Payer: Self-pay | Admitting: Medical

## 2018-10-07 ENCOUNTER — Telehealth: Payer: Self-pay

## 2018-10-07 NOTE — Telephone Encounter (Signed)
Left a message regarding appt on 10/08/18.

## 2018-10-07 NOTE — Telephone Encounter (Signed)
Spoke with pt regarding appt on 10/08/18. Pt stated he is not interested in [place]. Pt was advise to do a phone call and agreed. Pt questions were address.

## 2018-10-08 ENCOUNTER — Telehealth (INDEPENDENT_AMBULATORY_CARE_PROVIDER_SITE_OTHER): Payer: Medicare HMO | Admitting: Internal Medicine

## 2018-10-08 ENCOUNTER — Encounter: Payer: Self-pay | Admitting: Internal Medicine

## 2018-10-08 VITALS — BP 134/73 | HR 55 | Ht 72.0 in | Wt 185.0 lb

## 2018-10-08 DIAGNOSIS — I10 Essential (primary) hypertension: Secondary | ICD-10-CM

## 2018-10-08 DIAGNOSIS — I2510 Atherosclerotic heart disease of native coronary artery without angina pectoris: Secondary | ICD-10-CM | POA: Diagnosis not present

## 2018-10-08 DIAGNOSIS — I480 Paroxysmal atrial fibrillation: Secondary | ICD-10-CM | POA: Diagnosis not present

## 2018-10-08 NOTE — Progress Notes (Signed)
Electrophysiology TeleHealth Note   Due to national recommendations of social distancing due to [place] 19, an audio telehealth visit is felt to be most appropriate for this patient at this time.  Verbal consent was obtained by me for the telehealth visit today.  The patient does not have capability for a virtual visit.  A phone visit is therefore required today.   Date:  10/08/2018   ID:  John Baldy., DOB 11-14-1940, MRN [id number]  Location: patient's home  Provider location:  [facility]  Evaluation Performed: Follow-up visit  PCP:  [name], MD   Electrophysiologist:  Dr Rayann Heman  Chief Complaint:  palpitations  History of Present Illness:    John Kestel. is a 78 y.o. male who presents via telehealth conferencing today.  Since last being seen in our clinic, the patient reports doing very well.  He is playing golf 4-5 times per week without limitation. Today, he denies symptoms of palpitations, chest pain, shortness of breath,  lower extremity edema, dizziness, presyncope, or syncope.  The patient is otherwise without complaint today.  The patient denies symptoms of fevers, chills, cough, or new SOB worrisome for COVID 19.  Past Medical History:  Diagnosis Date  . Atrial flutter ([place])    s/p CTI ablation 10/20/09  . Chronic lower back pain   . Coronary artery disease   . Dry age-related macular degeneration of right eye   . Hay fever   . High cholesterol   . Hypertension   . Hyperthyroidism    "never treated for this" (08/20/2017)  . Paroxysmal atrial fibrillation ([place])    s/p PVI 10/20/09, 10/19/10  . Pneumonia 1992    Past Surgical History:  Procedure Laterality Date  . ATRIAL ABLATION SURGERY  10/20/2009; 10/19/2010   PVI and CTI ablation by JA   . CARDIOVERSION  2001  . CATARACT EXTRACTION W/ INTRAOCULAR LENS  IMPLANT, BILATERAL Bilateral 08/2010  . COLONOSCOPY     "~ 6 month after 1st colonoscopy; no polyps 2nd time"  . COLONOSCOPY W/ BIOPSIES AND  POLYPECTOMY    . CORONARY STENT INTERVENTION N/A 08/20/2017   Procedure: CORONARY STENT INTERVENTION;  Surgeon: [name], MD;  Location: [place] [facility];  Service: Cardiovascular;  Laterality: N/A;  . RIGHT/LEFT HEART CATH AND CORONARY ANGIOGRAPHY N/A 08/20/2017   Procedure: RIGHT/LEFT HEART CATH AND CORONARY ANGIOGRAPHY;  Surgeon: [name], MD;  Location: [place] [facility];  Service: Cardiovascular;  Laterality: N/A;  . TRANSESOPHAGEAL ECHOCARDIOGRAM  10/2009   EF: 60-65%    Current Outpatient Medications  Medication Sig Dispense Refill  . aspirin 81 MG chewable tablet Chew 1 tablet (81 mg total) by mouth daily. 90 tablet 3  . clopidogrel (PLAVIX) 75 MG tablet TAKE 1 TABLET EVERY DAY 90 tablet 3  . Cyanocobalamin (B-12) 2500 MCG TABS Take 1 tablet by mouth daily.    . ferrous sulfate 325 (65 FE) MG tablet Take 325 mg by mouth daily with breakfast.    . lisinopril (ZESTRIL) 10 MG tablet Take 10 mg by mouth as needed (Elevated blood pressure).    . meclizine (ANTIVERT) 25 MG tablet Take 1 tablet by mouth as needed for dizziness.    . Multiple Vitamin (MULTIVITAMIN) tablet Take 1 tablet by mouth daily.      . Multiple Vitamins-Minerals (PRESERVISION/LUTEIN PO) Take 1 capsule by mouth 2 (two) times daily.     . Omega-3 Fatty Acids (FISH OIL) 1000 MG CPDR Take 1 capsule  by mouth daily.     . pravastatin (PRAVACHOL) 40 MG tablet Take 40 mg by mouth daily.     . predniSONE (DELTASONE) 10 MG tablet Take 1 tablet by mouth as needed for pain.    . sotalol (BETAPACE) 160 MG tablet TAKE 1 TABLET (160 MG TOTAL) BY MOUTH 2 (TWO) TIMES DAILY. 180 tablet 3  . nitroGLYCERIN (NITROSTAT) 0.4 MG SL tablet Place 1 tablet (0.4 mg total) under the tongue every 5 (five) minutes as needed for chest pain. 25 tablet 3   No current facility-administered medications for this visit.     Allergies:   Patient has no known allergies.   Social History:  The patient  reports that he has  never smoked. He has never used smokeless tobacco. He reports current alcohol use of about 2.0 standard drinks of alcohol per week. He reports that he does not use drugs.   Family History:  The patient's family history includes Heart failure in his father.   ROS:  Please see the history of present illness.   All other systems are personally reviewed and negative.    Exam:    Vital Signs:  BP 134/73   Pulse (!) 55   Ht 6' (1.829 m)   Wt 185 lb (83.9 kg)   BMI 25.09 kg/m   Well sounding   Labs/Other Tests and Data Reviewed:    Recent Labs: No results found for requested labs within last 8760 hours.   Wt Readings from Last 3 Encounters:  10/08/18 185 lb (83.9 kg)  02/16/18 191 lb (86.6 kg)  11/20/17 185 lb 12.8 oz (84.3 kg)      ASSESSMENT & PLAN:    1.  Paroxysmal atrial fibrillation Stable with sotalol chads2vasc score is 4.  He has declined anticoagulation Will need bmet, mg and ekg on return  2. HTN Stable No change required today  3. CAD Stop plavix as he is 12 months out from his stent (as per Dr Alyse Low note) Continue ASA.  Ideally he would stop asa and go on [facility] therapy.  Unfortunately, he again declines today.  4. HL Check fasting lipids and LFTs on return   Follow-up:  6 months with EP PA   Patient Risk:  after full review of this patients clinical status, I feel that they are at moderate risk at this time.  Today, I have spent 15 minutes with the patient with telehealth technology discussing arrhythmia management .    Army Fossa, MD  10/08/2018 10:37 AM     [facility] [address] [place] [address] [phone] (office) [phone] (fax)

## 2019-02-11 DIAGNOSIS — H43813 Vitreous degeneration, bilateral: Secondary | ICD-10-CM | POA: Diagnosis not present

## 2019-02-11 DIAGNOSIS — H5213 Myopia, bilateral: Secondary | ICD-10-CM | POA: Diagnosis not present

## 2019-02-11 DIAGNOSIS — Z961 Presence of intraocular lens: Secondary | ICD-10-CM | POA: Diagnosis not present

## 2019-02-11 DIAGNOSIS — H35371 Puckering of macula, right eye: Secondary | ICD-10-CM | POA: Diagnosis not present

## 2019-04-05 DIAGNOSIS — Z125 Encounter for screening for malignant neoplasm of prostate: Secondary | ICD-10-CM | POA: Diagnosis not present

## 2019-04-05 DIAGNOSIS — Z0000 Encounter for general adult medical examination without abnormal findings: Secondary | ICD-10-CM | POA: Diagnosis not present

## 2019-04-05 DIAGNOSIS — E7849 Other hyperlipidemia: Secondary | ICD-10-CM | POA: Diagnosis not present

## 2019-04-12 ENCOUNTER — Telehealth (INDEPENDENT_AMBULATORY_CARE_PROVIDER_SITE_OTHER): Payer: Medicare HMO | Admitting: Internal Medicine

## 2019-04-12 ENCOUNTER — Telehealth: Payer: Self-pay

## 2019-04-12 ENCOUNTER — Other Ambulatory Visit: Payer: Self-pay

## 2019-04-12 DIAGNOSIS — Z9861 Coronary angioplasty status: Secondary | ICD-10-CM | POA: Diagnosis not present

## 2019-04-12 DIAGNOSIS — M545 Low back pain: Secondary | ICD-10-CM | POA: Diagnosis not present

## 2019-04-12 DIAGNOSIS — E785 Hyperlipidemia, unspecified: Secondary | ICD-10-CM | POA: Diagnosis not present

## 2019-04-12 DIAGNOSIS — Z0000 Encounter for general adult medical examination without abnormal findings: Secondary | ICD-10-CM | POA: Diagnosis not present

## 2019-04-12 DIAGNOSIS — I10 Essential (primary) hypertension: Secondary | ICD-10-CM | POA: Diagnosis not present

## 2019-04-12 DIAGNOSIS — I2510 Atherosclerotic heart disease of native coronary artery without angina pectoris: Secondary | ICD-10-CM

## 2019-04-12 DIAGNOSIS — Z1339 Encounter for screening examination for other mental health and behavioral disorders: Secondary | ICD-10-CM | POA: Diagnosis not present

## 2019-04-12 DIAGNOSIS — I480 Paroxysmal atrial fibrillation: Secondary | ICD-10-CM

## 2019-04-12 DIAGNOSIS — E7800 Pure hypercholesterolemia, unspecified: Secondary | ICD-10-CM

## 2019-04-12 DIAGNOSIS — Z1331 Encounter for screening for depression: Secondary | ICD-10-CM | POA: Diagnosis not present

## 2019-04-12 NOTE — Telephone Encounter (Signed)
-----   Message from Thompson Grayer, MD sent at 04/12/2019 12:07 PM EST ----- Request labs from PCP (just had done) Bring in to the office just for an ekg  Follow-up with me in 6 months

## 2019-04-12 NOTE — Progress Notes (Signed)
Electrophysiology TeleHealth Note  Due to national recommendations of social distancing due to [place] 19, an audio telehealth visit is felt to be most appropriate for this patient at this time.  Verbal consent was obtained by me for the telehealth visit today.  The patient does not have capability for a virtual visit.  A phone visit is therefore required today.   Date:  04/12/2019   ID:  John Sheppard., DOB [birth year], MRN [id number]  Location: patient's home  Provider location:  [place]  Evaluation Performed: Follow-up visit  PCP:  [name], MD   Electrophysiologist:  Dr Rayann Heman  Chief Complaint:  palpitations  History of Present Illness:    John Sheppard. is a 79 y.o. male who presents via telehealth conferencing today.  Since last being seen in our clinic, the patient reports doing very well.  Today, he denies symptoms of palpitations, chest pain, shortness of breath,  lower extremity edema, dizziness, presyncope, or syncope.  The patient is otherwise without complaint today.  The patient denies symptoms of fevers, chills, cough, or new SOB worrisome for COVID 19.  Past Medical History:  Diagnosis Date  . Atrial flutter ([place])    s/p CTI ablation 10/20/09  . Chronic lower back pain   . Coronary artery disease   . Dry age-related macular degeneration of right eye   . Hay fever   . High cholesterol   . Hypertension   . Hyperthyroidism    "never treated for this" (08/20/2017)  . Paroxysmal atrial fibrillation ([place])    s/p PVI 10/20/09, 10/19/10  . Pneumonia 1992    Past Surgical History:  Procedure Laterality Date  . ATRIAL ABLATION SURGERY  10/20/2009; 10/19/2010   PVI and CTI ablation by JA   . CARDIOVERSION  2001  . CATARACT EXTRACTION W/ INTRAOCULAR LENS  IMPLANT, BILATERAL Bilateral 08/2010  . COLONOSCOPY     "~ 6 month after 1st colonoscopy; no polyps 2nd time"  . COLONOSCOPY W/ BIOPSIES AND POLYPECTOMY    . CORONARY STENT INTERVENTION N/A  08/20/2017   Procedure: CORONARY STENT INTERVENTION;  Surgeon: [name], MD;  Location: [place] [facility];  Service: Cardiovascular;  Laterality: N/A;  . RIGHT/LEFT HEART CATH AND CORONARY ANGIOGRAPHY N/A 08/20/2017   Procedure: RIGHT/LEFT HEART CATH AND CORONARY ANGIOGRAPHY;  Surgeon: [name], MD;  Location: [place] [facility];  Service: Cardiovascular;  Laterality: N/A;  . TRANSESOPHAGEAL ECHOCARDIOGRAM  10/2009   EF: 60-65%    Current Outpatient Medications  Medication Sig Dispense Refill  . aspirin 81 MG chewable tablet Chew 1 tablet (81 mg total) by mouth daily. 90 tablet 3  . clopidogrel (PLAVIX) 75 MG tablet TAKE 1 TABLET EVERY DAY 90 tablet 3  . Cyanocobalamin (B-12) 2500 MCG TABS Take 1 tablet by mouth daily.    . ferrous sulfate 325 (65 FE) MG tablet Take 325 mg by mouth daily with breakfast.    . lisinopril (ZESTRIL) 10 MG tablet Take 10 mg by mouth as needed (Elevated blood pressure).    . meclizine (ANTIVERT) 25 MG tablet Take 1 tablet by mouth as needed for dizziness.    . Multiple Vitamin (MULTIVITAMIN) tablet Take 1 tablet by mouth daily.      . Multiple Vitamins-Minerals (PRESERVISION/LUTEIN PO) Take 1 capsule by mouth 2 (two) times daily.     . nitroGLYCERIN (NITROSTAT) 0.4 MG SL tablet Place 1 tablet (0.4 mg total) under the tongue every 5 (five) minutes as needed for  chest pain. 25 tablet 3  . Omega-3 Fatty Acids (FISH OIL) 1000 MG CPDR Take 1 capsule by mouth daily.     . pravastatin (PRAVACHOL) 40 MG tablet Take 40 mg by mouth daily.     . predniSONE (DELTASONE) 10 MG tablet Take 1 tablet by mouth as needed for pain.    . sotalol (BETAPACE) 160 MG tablet TAKE 1 TABLET (160 MG TOTAL) BY MOUTH 2 (TWO) TIMES DAILY. 180 tablet 3   No current facility-administered medications for this visit.    Allergies:   Patient has no known allergies.   Social History:  The patient  reports that he has never smoked. He has never used smokeless tobacco.  He reports current alcohol use of about 2.0 standard drinks of alcohol per week. He reports that he does not use drugs.   Family History:  The patient's family history includes Heart failure in his father.   ROS:  Please see the history of present illness.   All other systems are personally reviewed and negative.    Exam:    Vital Signs:  There were no vitals taken for this visit.  Well sounding, alert and conversant   Labs/Other Tests and Data Reviewed:    Recent Labs: No results found for requested labs within last 8760 hours.   Wt Readings from Last 3 Encounters:  10/08/18 185 lb (83.9 kg)  02/16/18 191 lb (86.6 kg)  11/20/17 185 lb 12.8 oz (84.3 kg)      ASSESSMENT & PLAN:    1.  Paroxysmal atrial fibrillation Stable  We discussed the importance of close monitoring to avoid toxicity with him on sotalol Will request labs from primary care Will have him come by our office for an ekg  2. HTN Stable No change required today  3. CAD No ischemic symptoms He is on ASA and has declined [place]  Stop plavix as advised 8/20  4. HL Recently switched to crestor I have advised that he have labs from PCP sent to me to review   Follow-up:  with me in 6 mopnths   Patient Risk:  after full review of this patients clinical status, I feel that they are at moderate risk at this time.  Today, I have spent 15 minutes with the patient with telehealth technology discussing arrhythmia management .    Army Fossa, MD  04/12/2019 12:04 PM     [place] [address] [phone] (office) [phone] (fax)

## 2019-04-19 NOTE — Telephone Encounter (Signed)
Call placed to Pt.  Will come in 3/9 at 12;30 pm for Ekg  Call placed to PCP office.  Lab work being faxed.  6 mo recall entered.

## 2019-04-20 ENCOUNTER — Other Ambulatory Visit: Payer: Self-pay

## 2019-04-20 ENCOUNTER — Ambulatory Visit (INDEPENDENT_AMBULATORY_CARE_PROVIDER_SITE_OTHER): Payer: Medicare HMO

## 2019-04-20 DIAGNOSIS — I480 Paroxysmal atrial fibrillation: Secondary | ICD-10-CM | POA: Diagnosis not present

## 2019-04-20 NOTE — Progress Notes (Signed)
1.) Reason for visit: routine EKG  2.) Name of MD requesting visit: [name]  3.) H&P: Paroxysmal atrial fibrillation on sotalol  4.) ROS related to problem: routine EKG  5.) Assessment and plan per MD: Forwarding routine EKG to ordering provider.  Labs received from PCP and sent to MR to scan in.

## 2019-04-20 NOTE — Patient Instructions (Signed)

## 2019-05-11 IMAGING — CR DG CHEST 2V
2 series · 2 of 2 positions shown · non-contrast
Comparison: None.

CLINICAL DATA: Shortness of breath.  Anxious.

EXAM:
CHEST - 2 VIEW

[chest pa]
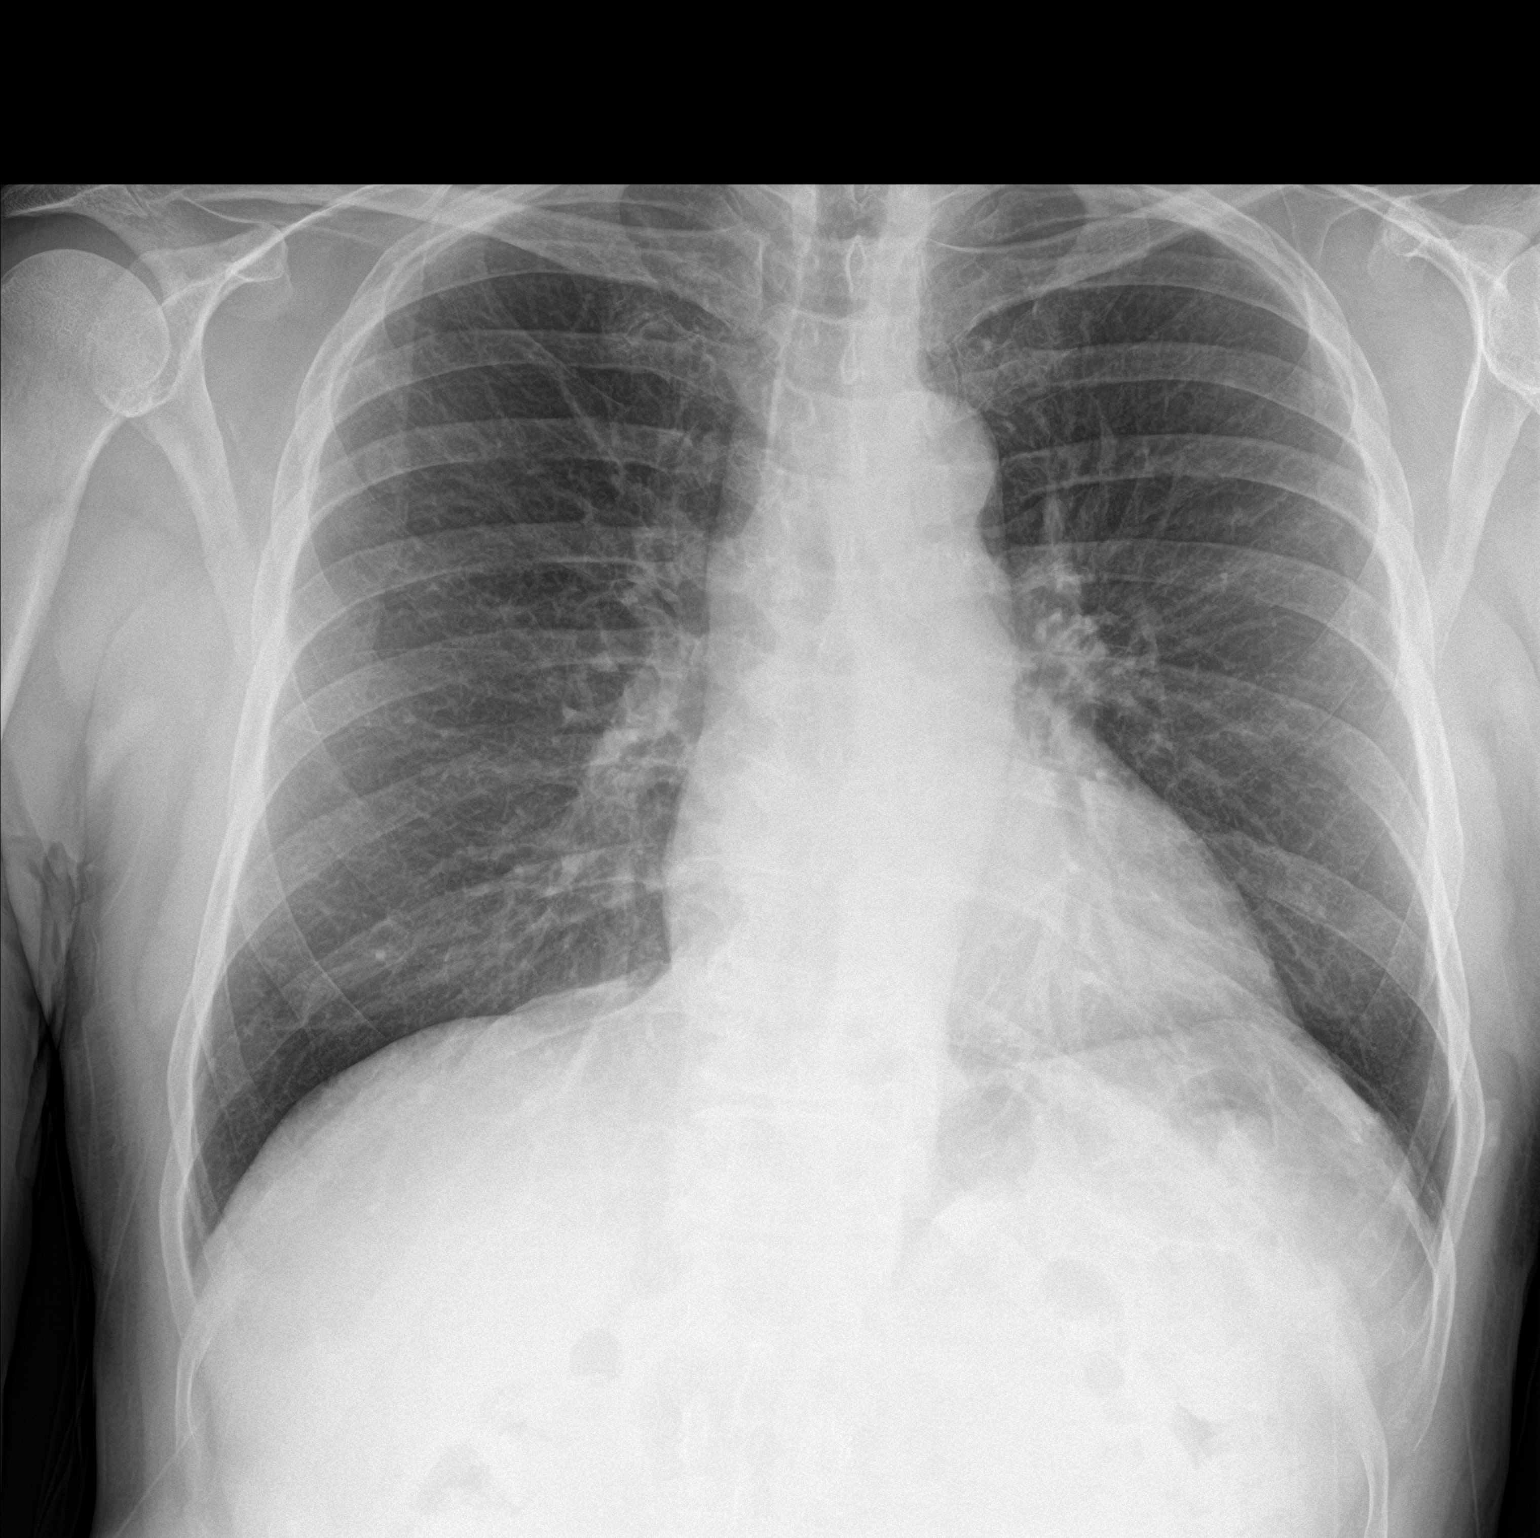

[chest lat]
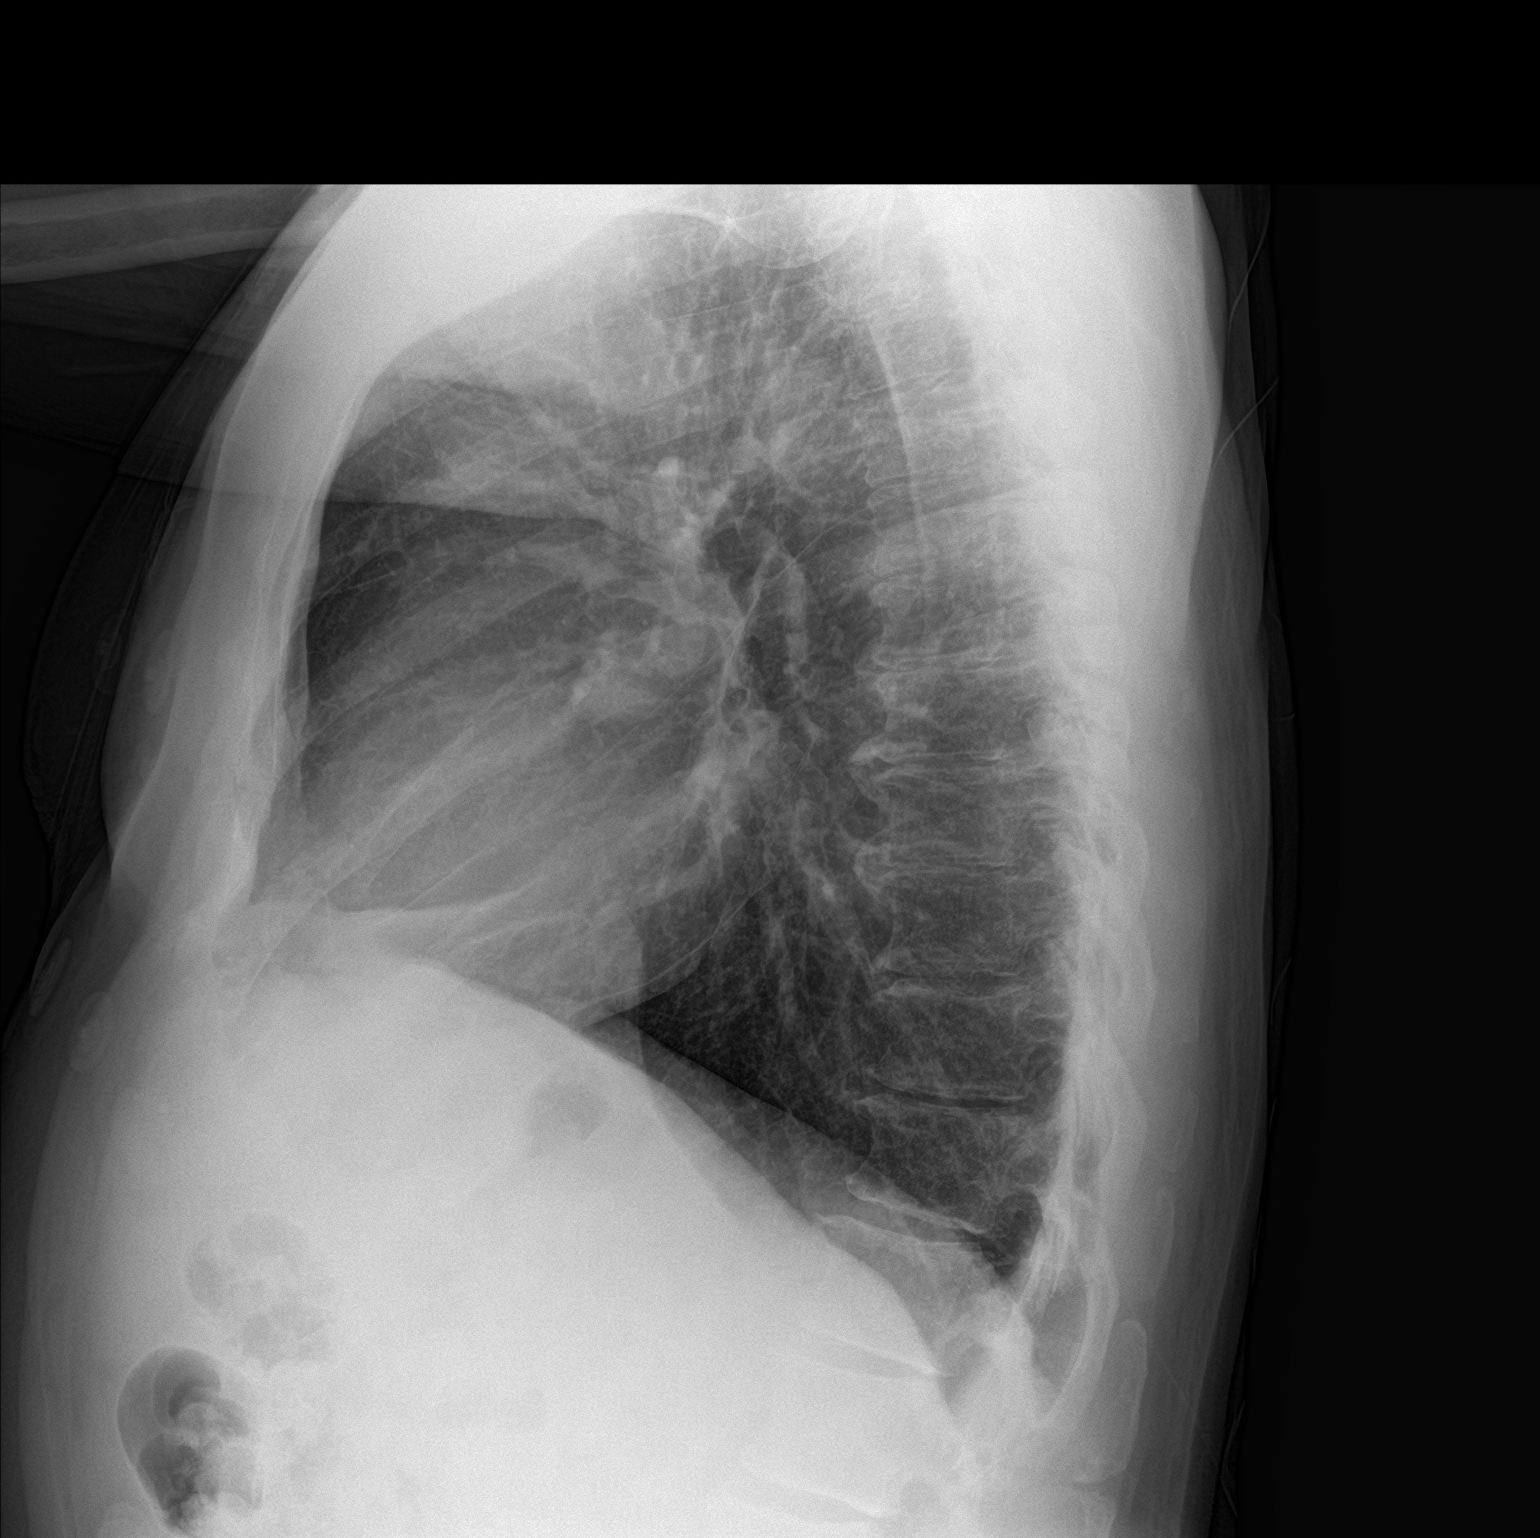

[2 of 2 positions shown; findings below may reference images not displayed]

FINDINGS: The heart size and mediastinal contours are within normal limits.
Both lungs are clear. The visualized skeletal structures are
unremarkable.
IMPRESSION: No active cardiopulmonary disease.

## 2019-08-06 ENCOUNTER — Other Ambulatory Visit: Payer: Self-pay | Admitting: Internal Medicine

## 2019-08-06 MED ORDER — LISINOPRIL 10 MG PO TABS: 10.0000 mg | ORAL_TABLET | ORAL | 2 refills | Status: DC | PRN

## 2019-09-02 ENCOUNTER — Other Ambulatory Visit: Payer: Self-pay | Admitting: Internal Medicine

## 2019-10-25 ENCOUNTER — Ambulatory Visit: Payer: Medicare HMO | Admitting: Internal Medicine

## 2019-10-25 ENCOUNTER — Encounter: Payer: Self-pay | Admitting: Internal Medicine

## 2019-10-25 ENCOUNTER — Other Ambulatory Visit: Payer: Self-pay

## 2019-10-25 VITALS — BP 132/72 | HR 56 | Ht 72.0 in | Wt 191.0 lb

## 2019-10-25 DIAGNOSIS — I480 Paroxysmal atrial fibrillation: Secondary | ICD-10-CM | POA: Diagnosis not present

## 2019-10-25 DIAGNOSIS — I2510 Atherosclerotic heart disease of native coronary artery without angina pectoris: Secondary | ICD-10-CM | POA: Diagnosis not present

## 2019-10-25 DIAGNOSIS — I10 Essential (primary) hypertension: Secondary | ICD-10-CM

## 2019-10-25 DIAGNOSIS — E7800 Pure hypercholesterolemia, unspecified: Secondary | ICD-10-CM | POA: Diagnosis not present

## 2019-10-25 NOTE — Patient Instructions (Signed)
Medication Instructions:  Your physician recommends that you continue on your current medications as directed. Please refer to the Current Medication list given to you today.  *If you need a refill on your cardiac medications before your next appointment, please call your pharmacy*  Lab Work: [place], [place]  If you have labs (blood work) drawn today and your tests are completely normal, you will receive your results only by:  [place] (if you have MyChart) OR  A paper copy in the mail If you have any lab test that is abnormal or we need to change your treatment, we will call you to review the results.  Testing/Procedures: None ordered.  Follow-Up: At [facility], you and your health needs are our priority.  As part of our continuing mission to provide you with exceptional heart care, we have created designated Provider Care Teams.  These Care Teams include your primary Cardiologist (physician) and Advanced Practice Providers (APPs -  Physician Assistants and Nurse Practitioners) who all work together to provide you with the care you need, when you need it.  We recommend signing up for the patient portal called "MyChart".  Sign up information is provided on this After Visit Summary.  MyChart is used to connect with patients for Virtual Visits (Telemedicine).  Patients are able to view lab/test results, encounter notes, upcoming appointments, etc.  Non-urgent messages can be sent to your provider as well.   To learn more about what you can do with MyChart, go to [email or web address].    Your next appointment:   Your physician wants you to follow-up in: 6 months with [name]. You will receive a reminder letter in the mail two months in advance. If you don't receive a letter, please call our office to schedule the follow-up appointment.    Other Instructions:

## 2019-10-25 NOTE — Progress Notes (Signed)
PCP: [name], MD   Primary EP: Dr Roanna Epley Brandell Maready. is a 79 y.o. male who presents today for routine electrophysiology followup.  Since last being seen in our clinic, the patient reports doing very well.  Today, he denies symptoms of palpitations, chest pain, shortness of breath,  lower extremity edema, dizziness, presyncope, or syncope.  The patient is otherwise without complaint today.   Past Medical History:  Diagnosis Date  . Atrial flutter ([place])    s/p CTI ablation 10/20/09  . Chronic lower back pain   . Coronary artery disease   . Dry age-related macular degeneration of right eye   . Hay fever   . High cholesterol   . Hypertension   . Hyperthyroidism    "never treated for this" (08/20/2017)  . Paroxysmal atrial fibrillation ([place])    s/p PVI 10/20/09, 10/19/10  . Pneumonia 1992   Past Surgical History:  Procedure Laterality Date  . ATRIAL ABLATION SURGERY  10/20/2009; 10/19/2010   PVI and CTI ablation by JA   . CARDIOVERSION  2001  . CATARACT EXTRACTION W/ INTRAOCULAR LENS  IMPLANT, BILATERAL Bilateral 08/2010  . COLONOSCOPY     "~ 6 month after 1st colonoscopy; no polyps 2nd time"  . COLONOSCOPY W/ BIOPSIES AND POLYPECTOMY    . CORONARY STENT INTERVENTION N/A 08/20/2017   Procedure: CORONARY STENT INTERVENTION;  Surgeon: [name], MD;  Location: [place] [facility];  Service: Cardiovascular;  Laterality: N/A;  . RIGHT/LEFT HEART CATH AND CORONARY ANGIOGRAPHY N/A 08/20/2017   Procedure: RIGHT/LEFT HEART CATH AND CORONARY ANGIOGRAPHY;  Surgeon: [name], MD;  Location: [place] [facility];  Service: Cardiovascular;  Laterality: N/A;  . TRANSESOPHAGEAL ECHOCARDIOGRAM  10/2009   EF: 60-65%    ROS- all systems are reviewed and negatives except as per HPI above  Current Outpatient Medications  Medication Sig Dispense Refill  . aspirin 81 MG chewable tablet Chew 1 tablet (81 mg total) by mouth daily. 90 tablet 3  . clopidogrel (PLAVIX)  75 MG tablet TAKE 1 TABLET EVERY DAY 90 tablet 3  . Cyanocobalamin (B-12) 2500 MCG TABS Take 1 tablet by mouth daily.    . ferrous sulfate 325 (65 FE) MG tablet Take 325 mg by mouth daily with breakfast.    . gabapentin (NEURONTIN) 300 MG capsule Take 1 capsule by mouth as needed for pain.    Marland Kitchen lisinopril (ZESTRIL) 10 MG tablet Take 1 tablet (10 mg total) by mouth as needed (Elevated blood pressure). 90 tablet 2  . meclizine (ANTIVERT) 25 MG tablet Take 1 tablet by mouth as needed for dizziness.    . Multiple Vitamin (MULTIVITAMIN) tablet Take 1 tablet by mouth daily.      . Multiple Vitamins-Minerals (PRESERVISION/LUTEIN PO) Take 1 capsule by mouth 2 (two) times daily.     . Omega-3 Fatty Acids (FISH OIL) 1000 MG CPDR Take 1 capsule by mouth daily.     . predniSONE (DELTASONE) 10 MG tablet Take 1 tablet by mouth as needed for pain.    . rosuvastatin (CRESTOR) 20 MG tablet Take 1 tablet by mouth daily.    . sotalol (BETAPACE) 160 MG tablet TAKE 1 TABLET TWICE DAILY 180 tablet 2  . nitroGLYCERIN (NITROSTAT) 0.4 MG SL tablet Place 1 tablet (0.4 mg total) under the tongue every 5 (five) minutes as needed for chest pain. 25 tablet 3   No current facility-administered medications for this visit.    Physical Exam: Vitals:  10/25/19 1519  BP: 132/72  Pulse: (!) 56  SpO2: 94%  Weight: 191 lb (86.6 kg)  Height: 6' (1.829 m)    GEN- The patient is well appearing, alert and oriented x 3 today.   Head- normocephalic, atraumatic Eyes-  Sclera clear, conjunctiva pink Ears- hearing intact Oropharynx- clear Lungs- Clear to ausculation bilaterally, normal work of breathing Heart- Regular rate and rhythm, no murmurs, rubs or gallops, PMI not laterally displaced GI- soft, NT, ND, + BS Extremities- no clubbing, cyanosis, or edema  Wt Readings from Last 3 Encounters:  10/25/19 191 lb (86.6 kg)  10/08/18 185 lb (83.9 kg)  02/16/18 191 lb (86.6 kg)    EKG tracing ordered today is personally  reviewed and shows sinus rhythm, PR 190 msec, QRS 86 msec, QTc 403 msec  Assessment and Plan:  1. Paroxysmal atrial fibrillation Well controlled with sotalol post ablation We will continue to follow closely on sotalol to avoid toxicity with this medicine Qt is stable Bmet, mg today He has declined [place] therapy Labs from [place] from 2/21 reviewed.  2. CAD No ischemic symptoms  3. HTN Stable No change required today Continue asa and plavix  4. HL On crestor Lipids 2/21 reviewed  Risks, benefits and potential toxicities for medications prescribed and/or refilled reviewed with patient today.   Return in 6 months  Thompson Grayer MD, [facility] 10/25/2019 3:28 PM

## 2019-10-26 LAB — BASIC METABOLIC PANEL
BUN/Creatinine Ratio: 16 (ref 10–24)
BUN: 14 mg/dL (ref 8–27)
CO2: 23 mmol/L (ref 20–29)
Calcium: 8.9 mg/dL (ref 8.6–10.2)
Chloride: 103 mmol/L (ref 96–106)
Creatinine, Ser: 0.88 mg/dL (ref 0.76–1.27)
GFR calc Af Amer: 95 mL/min/{1.73_m2} (ref >59)
GFR calc non Af Amer: 82 mL/min/{1.73_m2} (ref >59)
Glucose: 99 mg/dL (ref 65–99)
Potassium: 4.7 mmol/L (ref 3.5–5.2)
Sodium: 139 mmol/L (ref 134–144)

## 2019-10-26 LAB — MAGNESIUM: Magnesium: 1.9 mg/dL (ref 1.6–2.3)

## 2019-12-17 ENCOUNTER — Ambulatory Visit: Payer: Medicare HMO | Attending: Internal Medicine

## 2019-12-17 ENCOUNTER — Other Ambulatory Visit (HOSPITAL_BASED_OUTPATIENT_CLINIC_OR_DEPARTMENT_OTHER): Payer: Self-pay | Admitting: Internal Medicine

## 2019-12-17 DIAGNOSIS — Z23 Encounter for immunization: Secondary | ICD-10-CM

## 2019-12-17 NOTE — Progress Notes (Signed)
   [id number]  Name:  John Sheppard.    MRN: [id number] DOB: Apr 01, 1940  12/17/2019  Mr. John Sheppard was observed post Covid-19 immunization for 15 minutes without incident. He was provided with Vaccine Information Sheet and instruction to access the V-Safe system.   Vaccinated by Unknown [place]  Mr. John Sheppard was instructed to call 911 with any severe reactions post vaccine: Marland Kitchen Difficulty breathing  . Swelling of face and throat  . A fast heartbeat  . A bad rash all over body  . Dizziness and weakness

## 2019-12-23 MED FILL — PFIZER-BIONTECH COVID-19 VA: 30 | 1 days supply | Qty: 0 | Fill #0

## 2019-12-30 DIAGNOSIS — H353131 Nonexudative age-related macular degeneration, bilateral, early dry stage: Secondary | ICD-10-CM | POA: Diagnosis not present

## 2019-12-30 DIAGNOSIS — H43813 Vitreous degeneration, bilateral: Secondary | ICD-10-CM | POA: Diagnosis not present

## 2019-12-30 DIAGNOSIS — H5213 Myopia, bilateral: Secondary | ICD-10-CM | POA: Diagnosis not present

## 2019-12-30 DIAGNOSIS — H35371 Puckering of macula, right eye: Secondary | ICD-10-CM | POA: Diagnosis not present

## 2020-04-10 DIAGNOSIS — E785 Hyperlipidemia, unspecified: Secondary | ICD-10-CM | POA: Diagnosis not present

## 2020-04-10 DIAGNOSIS — Z125 Encounter for screening for malignant neoplasm of prostate: Secondary | ICD-10-CM | POA: Diagnosis not present

## 2020-04-17 DIAGNOSIS — I480 Paroxysmal atrial fibrillation: Secondary | ICD-10-CM | POA: Diagnosis not present

## 2020-04-17 DIAGNOSIS — M79672 Pain in left foot: Secondary | ICD-10-CM | POA: Diagnosis not present

## 2020-04-17 DIAGNOSIS — E785 Hyperlipidemia, unspecified: Secondary | ICD-10-CM | POA: Diagnosis not present

## 2020-04-17 DIAGNOSIS — Z0000 Encounter for general adult medical examination without abnormal findings: Secondary | ICD-10-CM | POA: Diagnosis not present

## 2020-04-17 DIAGNOSIS — G8929 Other chronic pain: Secondary | ICD-10-CM | POA: Diagnosis not present

## 2020-04-17 DIAGNOSIS — L570 Actinic keratosis: Secondary | ICD-10-CM | POA: Diagnosis not present

## 2020-04-17 DIAGNOSIS — I10 Essential (primary) hypertension: Secondary | ICD-10-CM | POA: Diagnosis not present

## 2020-04-17 DIAGNOSIS — M545 Low back pain, unspecified: Secondary | ICD-10-CM | POA: Diagnosis not present

## 2020-04-17 DIAGNOSIS — R82998 Other abnormal findings in urine: Secondary | ICD-10-CM | POA: Diagnosis not present

## 2020-04-17 DIAGNOSIS — Z9861 Coronary angioplasty status: Secondary | ICD-10-CM | POA: Diagnosis not present

## 2020-04-18 DIAGNOSIS — I10 Essential (primary) hypertension: Secondary | ICD-10-CM | POA: Diagnosis not present

## 2020-05-01 ENCOUNTER — Encounter: Payer: Self-pay | Admitting: Internal Medicine

## 2020-05-01 ENCOUNTER — Other Ambulatory Visit: Payer: Self-pay

## 2020-05-01 ENCOUNTER — Ambulatory Visit: Payer: Medicare HMO | Admitting: Internal Medicine

## 2020-05-01 VITALS — BP 140/76 | HR 49 | Ht 72.0 in | Wt 189.0 lb

## 2020-05-01 DIAGNOSIS — I480 Paroxysmal atrial fibrillation: Secondary | ICD-10-CM

## 2020-05-01 DIAGNOSIS — I10 Essential (primary) hypertension: Secondary | ICD-10-CM | POA: Diagnosis not present

## 2020-05-01 DIAGNOSIS — I2510 Atherosclerotic heart disease of native coronary artery without angina pectoris: Secondary | ICD-10-CM | POA: Diagnosis not present

## 2020-05-01 DIAGNOSIS — E7800 Pure hypercholesterolemia, unspecified: Secondary | ICD-10-CM

## 2020-05-01 NOTE — Progress Notes (Signed)
PCP: [name], MD   Primary EP: Dr Roanna Epley Zaevion Parke. is a 80 y.o. male who presents today for routine electrophysiology followup.  Since last being seen in our clinic, the patient reports doing very well.  Today, he denies symptoms of palpitations, chest pain, shortness of breath,  lower extremity edema, dizziness, presyncope, or syncope.  The patient is otherwise without complaint today.   Past Medical History:  Diagnosis Date  . Atrial flutter ([place])    s/p CTI ablation 10/20/09  . Chronic lower back pain   . Coronary artery disease   . Dry age-related macular degeneration of right eye   . Hay fever   . High cholesterol   . Hypertension   . Hyperthyroidism    "never treated for this" (08/20/2017)  . Paroxysmal atrial fibrillation ([place])    s/p PVI 10/20/09, 10/19/10  . Pneumonia 1992   Past Surgical History:  Procedure Laterality Date  . ATRIAL ABLATION SURGERY  10/20/2009; 10/19/2010   PVI and CTI ablation by JA   . CARDIOVERSION  2001  . CATARACT EXTRACTION W/ INTRAOCULAR LENS  IMPLANT, BILATERAL Bilateral 08/2010  . COLONOSCOPY     "~ 6 month after 1st colonoscopy; no polyps 2nd time"  . COLONOSCOPY W/ BIOPSIES AND POLYPECTOMY    . CORONARY STENT INTERVENTION N/A 08/20/2017   Procedure: CORONARY STENT INTERVENTION;  Surgeon: [name], MD;  Location: [place] [facility];  Service: Cardiovascular;  Laterality: N/A;  . RIGHT/LEFT HEART CATH AND CORONARY ANGIOGRAPHY N/A 08/20/2017   Procedure: RIGHT/LEFT HEART CATH AND CORONARY ANGIOGRAPHY;  Surgeon: [name], MD;  Location: [place] [facility];  Service: Cardiovascular;  Laterality: N/A;  . TRANSESOPHAGEAL ECHOCARDIOGRAM  10/2009   EF: 60-65%    ROS- all systems are reviewed and negatives except as per HPI above  Current Outpatient Medications  Medication Sig Dispense Refill  . aspirin 81 MG chewable tablet Chew 1 tablet (81 mg total) by mouth daily. 90 tablet 3  . Cyanocobalamin (B-12)  2500 MCG TABS Take 1 tablet by mouth daily.    . ferrous sulfate 325 (65 FE) MG tablet Take 325 mg by mouth daily with breakfast.    . lisinopril (ZESTRIL) 10 MG tablet Take 1 tablet (10 mg total) by mouth as needed (Elevated blood pressure). 90 tablet 2  . meclizine (ANTIVERT) 25 MG tablet Take 1 tablet by mouth as needed for dizziness.    . Multiple Vitamin (MULTIVITAMIN) tablet Take 1 tablet by mouth daily.      . Multiple Vitamins-Minerals (PRESERVISION/LUTEIN PO) Take 1 capsule by mouth 2 (two) times daily.     . Omega-3 Fatty Acids (FISH OIL) 1000 MG CPDR Take 1 capsule by mouth daily.     . rosuvastatin (CRESTOR) 20 MG tablet Take 1 tablet by mouth daily.    . sotalol (BETAPACE) 160 MG tablet TAKE 1 TABLET TWICE DAILY (Patient taking differently: Take 80 mg by mouth daily.) 180 tablet 2  . nitroGLYCERIN (NITROSTAT) 0.4 MG SL tablet Place 1 tablet (0.4 mg total) under the tongue every 5 (five) minutes as needed for chest pain. 25 tablet 3   No current facility-administered medications for this visit.    Physical Exam: Vitals:   05/01/20 1118  BP: 140/76  Pulse: (!) 49  SpO2: 96%  Weight: 189 lb (85.7 kg)  Height: 6' (1.829 m)    GEN- The patient is well appearing, alert and oriented x 3 today.   Head- normocephalic,  atraumatic Eyes-  Sclera clear, conjunctiva pink Ears- hearing intact Oropharynx- clear Lungs- Clear to ausculation bilaterally, normal work of breathing Heart- Regular rate and rhythm, no murmurs, rubs or gallops, PMI not laterally displaced GI- soft, NT, ND, + BS Extremities- no clubbing, cyanosis, or edema  Wt Readings from Last 3 Encounters:  05/01/20 189 lb (85.7 kg)  10/25/19 191 lb (86.6 kg)  10/08/18 185 lb (83.9 kg)    EKG tracing ordered today is personally reviewed and shows sinus bradycardia 49 bpm, QTc 404 msec  Assessment and Plan:  1. paroxysmal atrial fibrillation Well controlled post ablation with sotalol Qt is stable Recent labs  obtained by PCP He has declines [place] therapy chronically.  We may need to revisit if his AF burden increases.  2. HTN Stable  No change required today  3. CAD No ischemic symptoms  Risks, benefits and potential toxicities for medications prescribed and/or refilled reviewed with patient today.   Return to see EP PA in 6 months  Thompson Grayer MD, [facility] 05/01/2020 11:30 AM

## 2020-05-01 NOTE — Patient Instructions (Addendum)
Medication Instructions:  Your physician recommends that you continue on your current medications as directed. Please refer to the Current Medication list given to you today.  Labwork: None ordered.  Testing/Procedures: None ordered.  Follow-Up: Your physician wants you to follow-up in: 6 months with   Renee Ursuy, PA-C    You will receive a reminder letter in the mail two months in advance. If you don't receive a letter, please call our office to schedule the follow-up appointment.  Any Other Special Instructions Will Be Listed Below (If Applicable).  If you need a refill on your cardiac medications before your next appointment, please call your pharmacy.         

## 2020-05-29 ENCOUNTER — Other Ambulatory Visit: Payer: Self-pay | Admitting: Internal Medicine

## 2020-10-30 ENCOUNTER — Ambulatory Visit: Payer: Medicare HMO | Admitting: Student

## 2020-10-30 ENCOUNTER — Ambulatory Visit: Payer: Medicare HMO | Admitting: Physician Assistant

## 2020-10-30 ENCOUNTER — Encounter: Payer: Self-pay | Admitting: Student

## 2020-10-30 ENCOUNTER — Other Ambulatory Visit: Payer: Self-pay

## 2020-10-30 VITALS — BP 126/70 | HR 51 | Ht 72.0 in | Wt 189.6 lb

## 2020-10-30 DIAGNOSIS — I480 Paroxysmal atrial fibrillation: Secondary | ICD-10-CM

## 2020-10-30 DIAGNOSIS — I10 Essential (primary) hypertension: Secondary | ICD-10-CM | POA: Diagnosis not present

## 2020-10-30 DIAGNOSIS — I2510 Atherosclerotic heart disease of native coronary artery without angina pectoris: Secondary | ICD-10-CM

## 2020-10-30 LAB — BASIC METABOLIC PANEL
BUN/Creatinine Ratio: 14 (ref 10–24)
BUN: 11 mg/dL (ref 8–27)
CO2: 23 mmol/L (ref 20–29)
Calcium: 9.2 mg/dL (ref 8.6–10.2)
Chloride: 103 mmol/L (ref 96–106)
Creatinine, Ser: 0.81 mg/dL (ref 0.76–1.27)
Glucose: 70 mg/dL (ref 65–99)
Potassium: 4.7 mmol/L (ref 3.5–5.2)
Sodium: 139 mmol/L (ref 134–144)
eGFR: 90 mL/min/{1.73_m2} (ref >59)

## 2020-10-30 LAB — MAGNESIUM: Magnesium: 2.2 mg/dL (ref 1.6–2.3)

## 2020-10-30 NOTE — Progress Notes (Signed)
PCP:  [name], MD Primary Cardiologist: [name], MD Electrophysiologist: Thompson Grayer, MD   John Sheppard. is a 80 y.o. male seen today for Thompson Grayer, MD for routine electrophysiology followup.  Since last being seen in our clinic the patient reports doing very well. He denies feelings of any breakthrough arrhythmias.  he denies chest pain, palpitations, dyspnea, PND, orthopnea, nausea, vomiting, dizziness, syncope, edema, weight gain, or early satiety.  Past Medical History:  Diagnosis Date   Atrial flutter ([place])    s/p CTI ablation 10/20/09   Chronic lower back pain    Coronary artery disease    Dry age-related macular degeneration of right eye    Hay fever    High cholesterol    Hypertension    Hyperthyroidism    "never treated for this" (08/20/2017)   Paroxysmal atrial fibrillation ([place])    s/p PVI 10/20/09, 10/19/10   Pneumonia 1992   Past Surgical History:  Procedure Laterality Date   ATRIAL ABLATION SURGERY  10/20/2009; 10/19/2010   PVI and CTI ablation by JA    CARDIOVERSION  2001   CATARACT EXTRACTION W/ INTRAOCULAR LENS  IMPLANT, BILATERAL Bilateral 08/2010   COLONOSCOPY     "~ 6 month after 1st colonoscopy; no polyps 2nd time"   COLONOSCOPY W/ BIOPSIES AND POLYPECTOMY     CORONARY STENT INTERVENTION N/A 08/20/2017   Procedure: CORONARY STENT INTERVENTION;  Surgeon: [name], MD;  Location: [facility];  Service: Cardiovascular;  Laterality: N/A;   RIGHT/LEFT HEART CATH AND CORONARY ANGIOGRAPHY N/A 08/20/2017   Procedure: RIGHT/LEFT HEART CATH AND CORONARY ANGIOGRAPHY;  Surgeon: [name], MD;  Location: [facility];  Service: Cardiovascular;  Laterality: N/A;   TRANSESOPHAGEAL ECHOCARDIOGRAM  10/2009   EF: 60-65%    Current Outpatient Medications  Medication Sig Dispense Refill   aspirin 81 MG chewable tablet Chew 1 tablet (81 mg total) by mouth daily. 90 tablet 3   Cyanocobalamin (B-12) 2500 MCG TABS  Take 1 tablet by mouth daily.     ferrous sulfate 325 (65 FE) MG tablet Take 325 mg by mouth daily with breakfast.     lisinopril (ZESTRIL) 10 MG tablet TAKE 1 TABLET AS NEEDED (ELEVATED BLOOD PRESSURE). 90 tablet 3   meclizine (ANTIVERT) 25 MG tablet Take 1 tablet by mouth as needed for dizziness.     Multiple Vitamin (MULTIVITAMIN) tablet Take 1 tablet by mouth daily.       Multiple Vitamins-Minerals (PRESERVISION/LUTEIN PO) Take 1 capsule by mouth 2 (two) times daily.      nitroGLYCERIN (NITROSTAT) 0.4 MG SL tablet Place 1 tablet (0.4 mg total) under the tongue every 5 (five) minutes as needed for chest pain. 25 tablet 3   Omega-3 Fatty Acids (FISH OIL) 1000 MG CPDR Take 1 capsule by mouth daily.      rosuvastatin (CRESTOR) 20 MG tablet Take 1 tablet by mouth daily.     sotalol (BETAPACE) 160 MG tablet Take 80 mg by mouth 2 (two) times daily.     No current facility-administered medications for this visit.    Not on File  Social History   Socioeconomic History   Marital status: Married    Spouse name: [name]   Number of children: 2   Years of education: 16   Highest education level: Not on file  Occupational History   Occupation: Retired  Tobacco Use   Smoking status: Never   Smokeless tobacco: Never  Media planner  Vaping Use: Never used  Substance and Sexual Activity   Alcohol use: Yes    Alcohol/week: 2.0 standard drinks    Types: 2 Cans of beer per week   Drug use: Never   Sexual activity: Yes  Other Topics Concern   Not on file  Social History Narrative   Lives w/ wife   Caffeine use:  Drinks tea/diet pepsi daily   Social Determinants of Health   Financial Resource Strain: Not on file  Food Insecurity: Not on file  Transportation Needs: Not on file  Physical Activity: Not on file  Stress: Not on file  Social Connections: Not on file  Intimate Partner Violence: Not on file     Review of Systems: All other systems reviewed and are otherwise negative except  as noted above.  Physical Exam: Vitals:   10/30/20 1110  BP: 126/70  Pulse: (!) 51  SpO2: 99%  Weight: 189 lb 9.6 oz (86 kg)  Height: 6' (1.829 m)    GEN- The patient is well appearing, alert and oriented x 3 today.   HEENT: normocephalic, atraumatic; sclera clear, conjunctiva pink; hearing intact; oropharynx clear; neck supple, no JVP Lymph- no cervical lymphadenopathy Lungs- Clear to ausculation bilaterally, normal work of breathing.  No wheezes, rales, rhonchi Heart- Regular rate and rhythm, no murmurs, rubs or gallops, PMI not laterally displaced GI- soft, non-tender, non-distended, bowel sounds present, no hepatosplenomegaly Extremities- no clubbing, cyanosis, or edema; DP/PT/radial pulses 2+ bilaterally MS- no significant deformity or atrophy Skin- warm and dry, no rash or lesion Psych- euthymic mood, full affect Neuro- strength and sensation are intact  EKG is ordered. Personal review of EKG from today shows sinus bradycardia at 51 bpm, QTc stable on sotalol  Additional studies reviewed include: Previous EP office notes.   Assessment and Plan:  1. Paroxysmal atrial fibrillation Well controlled post ablation with sotalol QT is stable by EKG today Has declined [place] chronically despite CHA2DS2/VASc of at least 4. Discussed at length today and re-iterates he wishes to avoid if at all possible.   2. HTN Stable on current regimen   3. CAD Denies s/s ischemia  RTC 6 months. Labs today.    Shirley Friar, PA-C  10/30/20 11:23 AM

## 2020-10-30 NOTE — Patient Instructions (Signed)
Medication Instructions:  Your physician recommends that you continue on your current medications as directed. Please refer to the Current Medication list given to you today.  *If you need a refill on your cardiac medications before your next appointment, please call your pharmacy*   Lab Work: TODAY: BMET, Mg  If you have labs (blood work) drawn today and your tests are completely normal, you will receive your results only by: [place] (if you have MyChart) OR A paper copy in the mail If you have any lab test that is abnormal or we need to change your treatment, we will call you to review the results.   Follow-Up: At [facility], you and your health needs are our priority.  As part of our continuing mission to provide you with exceptional heart care, we have created designated Provider Care Teams.  These Care Teams include your primary Cardiologist (physician) and Advanced Practice Providers (APPs -  Physician Assistants and Nurse Practitioners) who all work together to provide you with the care you need, when you need it.  We recommend signing up for the patient portal called "MyChart".  Sign up information is provided on this After Visit Summary.  MyChart is used to connect with patients for Virtual Visits (Telemedicine).  Patients are able to view lab/test results, encounter notes, upcoming appointments, etc.  Non-urgent messages can be sent to your provider as well.   To learn more about what you can do with MyChart, go to [email or web address].    Your next appointment:   6 month(s)  The format for your next appointment:   In Person  Provider:   You may see Thompson Grayer, MD or one of the following Advanced Practice Providers on your designated Care Team:   Tommye Standard, [place] Legrand Como "[facility]" [place], [place]

## 2021-01-01 DIAGNOSIS — Z961 Presence of intraocular lens: Secondary | ICD-10-CM | POA: Diagnosis not present

## 2021-01-01 DIAGNOSIS — H524 Presbyopia: Secondary | ICD-10-CM | POA: Diagnosis not present

## 2021-01-01 DIAGNOSIS — H5213 Myopia, bilateral: Secondary | ICD-10-CM | POA: Diagnosis not present

## 2021-01-01 DIAGNOSIS — H35371 Puckering of macula, right eye: Secondary | ICD-10-CM | POA: Diagnosis not present

## 2021-03-20 ENCOUNTER — Telehealth: Payer: Self-pay | Admitting: Student

## 2021-03-20 MED ORDER — SOTALOL HCL 160 MG PO TABS: 80.0000 mg | ORAL_TABLET | Freq: Two times a day (BID) | ORAL | 0 refills | Status: DC

## 2021-03-20 NOTE — Telephone Encounter (Signed)
Pt's medication was sent to pt's pharmacy as requested. Confirmation received.  °

## 2021-03-20 NOTE — Telephone Encounter (Signed)
°*  STAT* If patient is at the pharmacy, call can be transferred to refill team.   1. Which medications need to be refilled? (please list name of each medication and dose if known) sotalol (BETAPACE) 160 MG tablet  2. Which pharmacy/location (including street and city if local pharmacy) is medication to be sent to? [facility] # [place], [place]  3. Do they need a 30 day or 90 day supply? 30 day   Patient will be out of medication tomorrow. Patient's wife states his normal pharmacy has not gotten their shipment of the medication so they would like a 30 day supply sent to [facility].

## 2021-03-22 ENCOUNTER — Other Ambulatory Visit: Payer: Self-pay

## 2021-03-22 MED ORDER — SOTALOL HCL 160 MG PO TABS: 80.0000 mg | ORAL_TABLET | Freq: Two times a day (BID) | ORAL | 2 refills | Status: DC

## 2021-03-22 NOTE — Telephone Encounter (Signed)
Pt's medication was sent to pt's pharmacy as requested. Confirmation received.  °

## 2021-05-07 ENCOUNTER — Ambulatory Visit: Payer: Medicare HMO | Admitting: Student

## 2021-05-17 ENCOUNTER — Encounter: Payer: Self-pay | Admitting: Internal Medicine

## 2021-05-17 ENCOUNTER — Ambulatory Visit: Payer: Medicare HMO | Admitting: Internal Medicine

## 2021-05-17 VITALS — BP 134/74 | HR 56 | Ht 71.0 in | Wt 186.0 lb

## 2021-05-17 DIAGNOSIS — I480 Paroxysmal atrial fibrillation: Secondary | ICD-10-CM

## 2021-05-17 DIAGNOSIS — E7800 Pure hypercholesterolemia, unspecified: Secondary | ICD-10-CM

## 2021-05-17 DIAGNOSIS — I2510 Atherosclerotic heart disease of native coronary artery without angina pectoris: Secondary | ICD-10-CM

## 2021-05-17 DIAGNOSIS — I10 Essential (primary) hypertension: Secondary | ICD-10-CM | POA: Diagnosis not present

## 2021-05-17 DIAGNOSIS — Z125 Encounter for screening for malignant neoplasm of prostate: Secondary | ICD-10-CM | POA: Diagnosis not present

## 2021-05-17 DIAGNOSIS — E785 Hyperlipidemia, unspecified: Secondary | ICD-10-CM | POA: Diagnosis not present

## 2021-05-17 NOTE — Patient Instructions (Signed)
Medication Instructions:  ?Your physician recommends that you continue on your current medications as directed. Please refer to the Current Medication list given to you today. ?*If you need a refill on your cardiac medications before your next appointment, please call your pharmacy* ? ?Lab Work: ?None. ?If you have labs (blood work) drawn today and your tests are completely normal, you will receive your results only by: ?MyChart Message (if you have MyChart) OR ?A paper copy in the mail ?If you have any lab test that is abnormal or we need to change your treatment, we will call you to review the results. ? ?Testing/Procedures: ?None. ? ?Follow-Up: ?At [facility], you and your health needs are our priority.  As part of our continuing mission to provide you with exceptional heart care, we have created designated Provider Care Teams.  These Care Teams include your primary Cardiologist (physician) and Advanced Practice Providers (APPs -  Physician Assistants and Nurse Practitioners) who all work together to provide you with the care you need, when you need it. ? ?Your physician wants you to follow-up in: 6 months with Thompson Grayer, MD  ? ?  You will receive a reminder letter in the mail two months in advance. If you don't receive a letter, please call our office to schedule the follow-up appointment. ? ?We recommend signing up for the patient portal called "MyChart".  Sign up information is provided on this After Visit Summary.  MyChart is used to connect with patients for Virtual Visits (Telemedicine).  Patients are able to view lab/test results, encounter notes, upcoming appointments, etc.  Non-urgent messages can be sent to your provider as well.   ?To learn more about what you can do with MyChart, go to [email or web address].   ? ?Any Other Special Instructions Will Be Listed Below (If Applicable). ? ? ? ? ?  ? ? ?

## 2021-05-17 NOTE — Progress Notes (Signed)
? ?PCP: [name], MD ?  ?Primary EP: Dr Rayann Heman ? ?John Sheppard. is a 81 y.o. male who presents today for routine electrophysiology followup.  Since last being seen in our clinic, the patient reports doing very well.  His wife John Sheppard) has RA recently diagnosed.  He is very concerned about her.   He had AF briefly in January but none since.  Today, he denies symptoms of palpitations, chest pain, shortness of breath,  lower extremity edema, dizziness, presyncope, or syncope.  The patient is otherwise without complaint today.  ? ?Past Medical History:  ?Diagnosis Date  ? Atrial flutter ([place])   ? s/p CTI ablation 10/20/09  ? Chronic lower back pain   ? Coronary artery disease   ? Dry age-related macular degeneration of right eye   ? Hay fever   ? High cholesterol   ? Hypertension   ? Hyperthyroidism   ? "never treated for this" (08/20/2017)  ? Paroxysmal atrial fibrillation ([place])   ? s/p PVI 10/20/09, 10/19/10  ? Pneumonia 1992  ? ?Past Surgical History:  ?Procedure Laterality Date  ? ATRIAL ABLATION SURGERY  10/20/2009; 10/19/2010  ? PVI and CTI ablation by JA   ? CARDIOVERSION  2001  ? CATARACT EXTRACTION W/ INTRAOCULAR LENS  IMPLANT, BILATERAL Bilateral 08/2010  ? COLONOSCOPY    ? "~ 6 month after 1st colonoscopy; no polyps 2nd time"  ? COLONOSCOPY W/ BIOPSIES AND POLYPECTOMY    ? CORONARY STENT INTERVENTION N/A 08/20/2017  ? Procedure: CORONARY STENT INTERVENTION;  Surgeon: [name], MD;  Location: [place] [facility];  Service: Cardiovascular;  Laterality: N/A;  ? RIGHT/LEFT HEART CATH AND CORONARY ANGIOGRAPHY N/A 08/20/2017  ? Procedure: RIGHT/LEFT HEART CATH AND CORONARY ANGIOGRAPHY;  Surgeon: [name], MD;  Location: [place] [facility];  Service: Cardiovascular;  Laterality: N/A;  ? TRANSESOPHAGEAL ECHOCARDIOGRAM  10/2009  ? EF: 60-65%  ? ? ?ROS- all systems are reviewed and negatives except as per HPI above ? ?Current Outpatient Medications  ?Medication Sig Dispense Refill  ?  aspirin 81 MG chewable tablet Chew 1 tablet (81 mg total) by mouth daily. 90 tablet 3  ? Cyanocobalamin (B-12) 2500 MCG TABS Take 1 tablet by mouth daily.    ? ferrous sulfate 325 (65 FE) MG tablet Take 325 mg by mouth daily with breakfast.    ? gabapentin (NEURONTIN) 300 MG capsule Take 1 capsule by mouth daily.    ? lisinopril (ZESTRIL) 10 MG tablet TAKE 1 TABLET AS NEEDED (ELEVATED BLOOD PRESSURE). 90 tablet 3  ? meclizine (ANTIVERT) 25 MG tablet Take 1 tablet by mouth as needed for dizziness.    ? Multiple Vitamin (MULTIVITAMIN) tablet Take 1 tablet by mouth daily.      ? Multiple Vitamins-Minerals (PRESERVISION/LUTEIN PO) Take 1 capsule by mouth 2 (two) times daily.     ? Omega-3 Fatty Acids (FISH OIL) 1000 MG CPDR Take 1 capsule by mouth daily.     ? rosuvastatin (CRESTOR) 20 MG tablet Take 1 tablet by mouth daily.    ? sotalol (BETAPACE) 160 MG tablet Take 0.5 tablets (80 mg total) by mouth 2 (two) times daily. 90 tablet 2  ? nitroGLYCERIN (NITROSTAT) 0.4 MG SL tablet Place 1 tablet (0.4 mg total) under the tongue every 5 (five) minutes as needed for chest pain. 25 tablet 3  ? ?No current facility-administered medications for this visit.  ? ? ?Physical Exam: ?Vitals:  ? 05/17/21 1546  ?BP: 134/74  ?Pulse: [place] Kitchen)  56  ?SpO2: 93%  ?Weight: 186 lb (84.4 kg)  ?Height: '5\' 11"'$  (1.803 m)  ? ? ?GEN- The patient is well appearing, alert and oriented x 3 today.   ?Head- normocephalic, atraumatic ?Eyes-  Sclera clear, conjunctiva pink ?Ears- hearing intact ?Oropharynx- clear ?Lungs- Clear to ausculation bilaterally, normal work of breathing ?Heart- Regular rate and rhythm, no murmurs, rubs or gallops, PMI not laterally displaced ?GI- soft, NT, ND, + BS ?Extremities- no clubbing, cyanosis, or edema ? ?Wt Readings from Last 3 Encounters:  ?05/17/21 186 lb (84.4 kg)  ?10/30/20 189 lb 9.6 oz (86 kg)  ?05/01/20 189 lb (85.7 kg)  ? ? ?EKG tracing ordered today is personally reviewed and shows sinus rhythm 56 bpm, Qtc 420 msec, PR  200 msec ? ?Assessment and Plan: ? ?Paroxysmal atrial fibrillation ?Well controlled post ablation with sotalol ?Qt is stable ?Declines OAC ?Labs 10/30/20 reviewed ?He had labs today with Dr Sharlett Iles.  I have asked that he forward those to me for review ? ?2. HTN ?Stable ?No change required today ? ?3. CAD ?No ischemic symptoms ? ?4. HL ?He had labs today with Dr Sharlett Iles.  I have asked that he forward those to me for review ? ?Risks, benefits and potential toxicities for medications prescribed and/or refilled reviewed with patient today.  ? ?Return to see me in 6 months ? ?Thompson Grayer MD, FACC ?05/17/2021 ?4:02 PM ? ? ? ? ?

## 2021-05-24 DIAGNOSIS — Z1331 Encounter for screening for depression: Secondary | ICD-10-CM | POA: Diagnosis not present

## 2021-05-24 DIAGNOSIS — Z1339 Encounter for screening examination for other mental health and behavioral disorders: Secondary | ICD-10-CM | POA: Diagnosis not present

## 2021-05-24 DIAGNOSIS — E785 Hyperlipidemia, unspecified: Secondary | ICD-10-CM | POA: Diagnosis not present

## 2021-05-24 DIAGNOSIS — L570 Actinic keratosis: Secondary | ICD-10-CM | POA: Diagnosis not present

## 2021-05-24 DIAGNOSIS — M545 Low back pain, unspecified: Secondary | ICD-10-CM | POA: Diagnosis not present

## 2021-05-24 DIAGNOSIS — Z9861 Coronary angioplasty status: Secondary | ICD-10-CM | POA: Diagnosis not present

## 2021-05-24 DIAGNOSIS — I10 Essential (primary) hypertension: Secondary | ICD-10-CM | POA: Diagnosis not present

## 2021-05-24 DIAGNOSIS — I480 Paroxysmal atrial fibrillation: Secondary | ICD-10-CM | POA: Diagnosis not present

## 2021-05-24 DIAGNOSIS — Z0000 Encounter for general adult medical examination without abnormal findings: Secondary | ICD-10-CM | POA: Diagnosis not present

## 2021-06-11 ENCOUNTER — Other Ambulatory Visit: Payer: Self-pay | Admitting: Internal Medicine

## 2021-09-04 ENCOUNTER — Encounter (HOSPITAL_BASED_OUTPATIENT_CLINIC_OR_DEPARTMENT_OTHER): Payer: Self-pay

## 2021-09-04 ENCOUNTER — Emergency Department (HOSPITAL_BASED_OUTPATIENT_CLINIC_OR_DEPARTMENT_OTHER)
Admission: EM | Admit: 2021-09-04 | Discharge: 2021-09-05 | Disposition: A | Discharge status: Home / Self Care | Payer: Medicare HMO | Attending: Emergency Medicine | Admitting: Emergency Medicine

## 2021-09-04 ENCOUNTER — Other Ambulatory Visit: Payer: Self-pay

## 2021-09-04 ENCOUNTER — Emergency Department (HOSPITAL_BASED_OUTPATIENT_CLINIC_OR_DEPARTMENT_OTHER): Payer: Medicare HMO | Admitting: Radiology

## 2021-09-04 DIAGNOSIS — R4182 Altered mental status, unspecified: Secondary | ICD-10-CM

## 2021-09-04 DIAGNOSIS — Z79899 Other long term (current) drug therapy: Secondary | ICD-10-CM | POA: Diagnosis not present

## 2021-09-04 DIAGNOSIS — R519 Headache, unspecified: Secondary | ICD-10-CM | POA: Diagnosis not present

## 2021-09-04 DIAGNOSIS — R531 Weakness: Secondary | ICD-10-CM | POA: Insufficient documentation

## 2021-09-04 DIAGNOSIS — R110 Nausea: Secondary | ICD-10-CM | POA: Insufficient documentation

## 2021-09-04 DIAGNOSIS — Z7982 Long term (current) use of aspirin: Secondary | ICD-10-CM | POA: Insufficient documentation

## 2021-09-04 DIAGNOSIS — I4891 Unspecified atrial fibrillation: Secondary | ICD-10-CM | POA: Diagnosis not present

## 2021-09-04 DIAGNOSIS — E039 Hypothyroidism, unspecified: Secondary | ICD-10-CM | POA: Insufficient documentation

## 2021-09-04 DIAGNOSIS — I10 Essential (primary) hypertension: Secondary | ICD-10-CM | POA: Diagnosis not present

## 2021-09-04 DIAGNOSIS — R0602 Shortness of breath: Secondary | ICD-10-CM | POA: Diagnosis not present

## 2021-09-04 LAB — CBC
HCT: 41.5 % (ref 39.0–52.0)
Hemoglobin: 14.3 g/dL (ref 13.0–17.0)
MCH: 33.3 pg (ref 26.0–34.0)
MCHC: 34.5 g/dL (ref 30.0–36.0)
MCV: 96.7 fL (ref 80.0–100.0)
Platelets: 207 10*3/uL (ref 150–400)
RBC: 4.29 MIL/uL (ref 4.22–5.81)
RDW: 13 % (ref 11.5–15.5)
WBC: 9.8 10*3/uL (ref 4.0–10.5)
nRBC: 0 % (ref 0.0–0.2)

## 2021-09-04 LAB — BASIC METABOLIC PANEL
Anion gap: 13 (ref 5–15)
BUN: 15 mg/dL (ref 8–23)
CO2: 23 mmol/L (ref 22–32)
Calcium: 9.7 mg/dL (ref 8.9–10.3)
Chloride: 100 mmol/L (ref 98–111)
Creatinine, Ser: 0.81 mg/dL (ref 0.61–1.24)
GFR, Estimated: >60 mL/min (ref >60)
Glucose, Bld: 124 mg/dL — ABNORMAL HIGH (ref 70–99)
Potassium: 4 mmol/L (ref 3.5–5.1)
Sodium: 136 mmol/L (ref 135–145)

## 2021-09-04 NOTE — ED Notes (Signed)
Patient and Family had Concerns regarding Wait Time and patient states he is not able to determine the Time on his Watch. Patient currently alert and oriented otherwise. Charge RN made aware.

## 2021-09-04 NOTE — ED Triage Notes (Signed)
Patient here POV from Home.  Endorses No Discernable Symptoms yesterday but since this AM the Patient endorses Multiple Symptoms including:  Mild SOB. Mild Headache. Mild Nausea. Memory Issues.   No Emesis. No Fevers. No Diarrhea. No Discernable Diarrhea. No CP.  NAD Noted during Triage. A&Ox4. GCS 15. Ambulatory.

## 2021-09-05 ENCOUNTER — Emergency Department (HOSPITAL_BASED_OUTPATIENT_CLINIC_OR_DEPARTMENT_OTHER): Payer: Medicare HMO

## 2021-09-05 DIAGNOSIS — R410 Disorientation, unspecified: Secondary | ICD-10-CM | POA: Diagnosis not present

## 2021-09-05 DIAGNOSIS — R519 Headache, unspecified: Secondary | ICD-10-CM | POA: Diagnosis not present

## 2021-09-05 DIAGNOSIS — R413 Other amnesia: Secondary | ICD-10-CM | POA: Diagnosis not present

## 2021-09-05 DIAGNOSIS — R4182 Altered mental status, unspecified: Secondary | ICD-10-CM | POA: Diagnosis not present

## 2021-09-05 MED ORDER — ACETAMINOPHEN 500 MG PO TABS
1000.0000 mg | ORAL_TABLET | Freq: Once | ORAL | Status: AC
  Administered 2021-09-05: 1000 mg via ORAL
  Filled 2021-09-05: qty 2

## 2021-09-05 NOTE — ED Notes (Signed)
Reviewed AVS/discharge instruction with patient. Time allotted for and all questions answered. Patient is agreeable for d/c and escorted to ed exit by staff.  

## 2021-09-05 NOTE — Discharge Instructions (Signed)
Continue medications as previously prescribed.  Follow-up with [facility] neurology in the next week.  Their contact information has been provided in this discharge summary.  They should call you to arrange an appointment.  Return to the emergency department if symptoms significantly worsen or change.

## 2021-09-05 NOTE — ED Provider Notes (Signed)
[facility] EMERGENCY DEPT Provider Note   CSN: [id number] Arrival date & time: 09/04/21  2027     History  Chief Complaint  Patient presents with   Multiple Complaints    John Sheppard. is a 81 y.o. male.  Patient is an 81 year old male with past medical history of atrial fibrillation, hypertension, macular degeneration, hyperthyroidism.  Patient presenting today for evaluation of feeling generally unwell.  This started earlier today.  He describes feeling somewhat nauseated, weak, and describes a mild headache to the right parietal region.  His wife describes that he seems to be having some memory issues.  He apparently could not remember having blood drawn while he was in the waiting room.  He also describes an inability to read his watch face.  He denies any visual disturbances, weakness, numbness, or other complaints.  He denies having started any new medications or any head trauma.  The history is provided by the patient.       Home Medications Prior to Admission medications   Medication Sig Start Date End Date Taking? Authorizing Provider  aspirin 81 MG chewable tablet Chew 1 tablet (81 mg total) by mouth daily. 08/21/17   Kroeger, Lorelee Cover., PA-C  Cyanocobalamin (B-12) 2500 MCG TABS Take 1 tablet by mouth daily.    [provider]  ferrous sulfate 325 (65 FE) MG tablet Take 325 mg by mouth daily with breakfast.    [provider]  gabapentin (NEURONTIN) 300 MG capsule Take 1 capsule by mouth daily. 05/03/21   [provider]  lisinopril (ZESTRIL) 10 MG tablet TAKE 1 TABLET AS NEEDED FOR ELEVATED BLOOD PRESSURE 06/12/21   Allred, Jeneen Rinks, MD  meclizine (ANTIVERT) 25 MG tablet Take 1 tablet by mouth as needed for dizziness. 09/17/18   [provider]  Multiple Vitamin (MULTIVITAMIN) tablet Take 1 tablet by mouth daily.      [provider]  Multiple Vitamins-Minerals (PRESERVISION/LUTEIN PO) Take 1 capsule by mouth 2  (two) times daily.     [provider]  nitroGLYCERIN (NITROSTAT) 0.4 MG SL tablet Place 1 tablet (0.4 mg total) under the tongue every 5 (five) minutes as needed for chest pain. 08/21/17 10/30/20  Kroeger, Lorelee Cover., PA-C  Omega-3 Fatty Acids (FISH OIL) 1000 MG CPDR Take 1 capsule by mouth daily.     [provider]  rosuvastatin (CRESTOR) 20 MG tablet Take 1 tablet by mouth daily. 08/22/19   [provider]  sotalol (BETAPACE) 160 MG tablet Take 0.5 tablets (80 mg total) by mouth 2 (two) times daily. 03/22/21   Shirley Friar, PA-C      Allergies    Patient has no allergy information on record.    Review of Systems   Review of Systems  All other systems reviewed and are negative.   Physical Exam Updated Vital Signs BP (!) 179/90   Pulse 72   Temp 98.1 F (36.7 C)   Resp 14   Ht '5\' 11"'$  (1.803 m)   Wt 84.4 kg   SpO2 97%   BMI 25.95 kg/m  Physical Exam Vitals and nursing note reviewed.  Constitutional:      General: He is not in acute distress.    Appearance: He is well-developed. He is not diaphoretic.  HENT:     Head: Normocephalic and atraumatic.  Eyes:     Extraocular Movements: Extraocular movements intact.     Pupils: Pupils are equal, round, and reactive to light.  Cardiovascular:  Rate and Rhythm: Normal rate and regular rhythm.     Heart sounds: No murmur heard.    No friction rub.  Pulmonary:     Effort: Pulmonary effort is normal. No respiratory distress.     Breath sounds: Normal breath sounds. No wheezing or rales.  Abdominal:     General: Bowel sounds are normal. There is no distension.     Palpations: Abdomen is soft.     Tenderness: There is no abdominal tenderness.  Musculoskeletal:        General: Normal range of motion.     Cervical back: Normal range of motion and neck supple.  Skin:    General: Skin is warm and dry.  Neurological:     General: No focal deficit present.     Mental Status: He is alert and  oriented to person, place, and time.     Cranial Nerves: No cranial nerve deficit.     Sensory: No sensory deficit.     Motor: No weakness.     Coordination: Coordination normal.     ED Results / Procedures / Treatments   Labs (all labs ordered are listed, but only abnormal results are displayed) Labs Reviewed  BASIC METABOLIC PANEL - Abnormal; Notable for the following components:      Result Value   Glucose, Bld 124 (*)    All other components within normal limits  CBC    EKG EKG Interpretation  Date/Time:  Tuesday September 04 2021 20:36:56 EDT Ventricular Rate:  77 PR Interval:  182 QRS Duration: 86 QT Interval:  396 QTC Calculation: 448 R Axis:   57 Text Interpretation: Normal sinus rhythm Nonspecific ST abnormality Abnormal ECG When compared with ECG of 21-Aug-2017 06:58, T wave amplitude has decreased in Inferior leads Nonspecific T wave abnormality no longer evident in Anterior leads Confirmed by Veryl Speak [phone]) on 09/05/2021 12:48:47 AM  Radiology DG Chest 2 View  Result Date: 09/04/2021 CLINICAL DATA:  Shortness of breath EXAM: CHEST - 2 VIEW COMPARISON:  06/16/2017 FINDINGS: Lungs are clear.  No pleural effusion or pneumothorax. The heart is normal in size. Degenerative changes of the visualized thoracolumbar spine. IMPRESSION: Normal chest radiographs. Electronically Signed   By: Julian Hy M.D.   On: 09/04/2021 22:44    Procedures Procedures    Medications Ordered in ED Medications - No data to display  ED Course/ Medical Decision Making/ A&P  This patient presents to the ED for concern of mental status change, this involves an extensive number of treatment options, and is a complaint that carries with it a high risk of complications and morbidity.  The differential diagnosis includes acute CVA, dementia, electrolyte disturbance   Co morbidities that complicate the patient evaluation  Atrial fibrillation on aspirin   Additional history  obtained:  No additional history or external records needed   Lab Tests:  I Ordered, and personally interpreted labs.  The pertinent results include: Unremarkable CBC and metabolic panel   Imaging Studies ordered:  I ordered imaging studies including CT scan of the head I independently visualized and interpreted imaging which showed no acute process I agree with the radiologist interpretation   Cardiac Monitoring: / EKG:  The patient was maintained on a cardiac monitor.  I personally viewed and interpreted the cardiac monitored which showed an underlying rhythm of: Sinus   Consultations Obtained:  I requested consultation with the neurologist, Dr. Cheral Marker,  and discussed lab and imaging findings as well as pertinent plan - they  recommend: Outpatient follow-up with [facility] neurology   Problem List / ED Course / Critical interventions / Medication management  Patient presenting here with complaints of weakness, headache, and having difficulty interpreting the time on his watch.  He also experienced some forgetfulness in the waiting room.  Patient's work-up here is unremarkable including EKG, laboratory studies, and CT scan of the head.  He appears neurologically intact to my exam with no focal deficits.  I discussed the situation with Dr. Cheral Marker from neurology.  I have had the patient draw both a flower and clock at his request.  He was able to do both.  In consultation with neurology, we both doubt this being an acute stroke and will refer patient to neurology as an outpatient. No medications given I have reviewed the patients home medicines and have made adjustments as needed   Social Determinants of Health:  None   Test / Admission - Considered:  Patient seems appropriate for outpatient follow-up and as needed return.  Final Clinical Impression(s) / ED Diagnoses Final diagnoses:  None    Rx / DC Orders ED Discharge Orders     None         Veryl Speak,  MD 09/05/21 [phone]

## 2021-09-27 ENCOUNTER — Encounter: Payer: Self-pay | Admitting: Neurology

## 2021-09-27 ENCOUNTER — Ambulatory Visit: Payer: Medicare HMO | Admitting: Neurology

## 2021-09-27 VITALS — BP 144/77 | HR 47 | Ht 70.0 in | Wt 182.0 lb

## 2021-09-27 DIAGNOSIS — R419 Unspecified symptoms and signs involving cognitive functions and awareness: Secondary | ICD-10-CM

## 2021-09-27 DIAGNOSIS — Z87898 Personal history of other specified conditions: Secondary | ICD-10-CM

## 2021-09-27 DIAGNOSIS — G459 Transient cerebral ischemic attack, unspecified: Secondary | ICD-10-CM | POA: Diagnosis not present

## 2021-09-27 DIAGNOSIS — H539 Unspecified visual disturbance: Secondary | ICD-10-CM

## 2021-09-27 NOTE — Progress Notes (Signed)
[facility]    Provider:  Dr Jaynee Eagles Referring Provider: Veryl Speak, MD Primary Care Physician:  [name], MD  CC:  Transient monocular vision changes, confusion, episodes of AMS, paroxysmal afib not on [facility]  09/27/2021: Patient here for follow up after ED visit. I reviewed emergency room notes, on 09/05/2021 he presented with complaints of weakness headache and having difficulty and per putting time on his watch.  Also forgetfulness in the waiting room.  Work-up was unremarkable including EKG, laboratory studies and T CT scan of the head, by the time he was examined he appeared to have no neurologic deficits.  Patient has afib paroxysmal and having episodes of TIA vs stroke vs migraine with aura: Patient is here with his wife, we discussed that there is some literature on taking eliquis only when having afib. He has had several ablations, very rarely now with afib on asa and not on AC. He had an episode of AMS for several hours, he had afib on Sunday (woke up with it) and played golf and 2 days later he was reading and his vision became fuzzy around the edges on the left like rainfall in the corner of the eye if he moved his eye it went away, he was reading a book and he was having difficulty reading, he could read the word but couldn;t understand the sentence, he wasn't feeling well that day all day and at 730pm he told his wife he needs to go to the emergency room and he had a headache, possibly shortness or breath unclear if anxiety, they check a CT head, he had a headache, they drew blood abd cxr. In the waiting room he couldn't tell time or the clock on the wall not because of vision he could see the numbers but couldn;t tell wife what time it was on a manual clock. He felt nauseated. Headache on the right, a pain, ran down to the jaw, not pulsating or pounding, no light or sound sensitivity. He had some episodes in 2017 but this was slightly different.  Patient  complains of symptoms per HPI as well as the following symptoms: episode ov vision changes and confusion . Pertinent negatives and positives per HPI. All others negative  Reviewed notes, labs and imaging from outside physicians, which showed:   BUN 15 creatinine 0.81 09/04/2021  CT head 09/05/2021: COMPARISON:  MRI 05/24/2015   FINDINGS: Brain: Diffuse cerebral atrophy. Mild ventricular dilatation consistent with central atrophy. A few patchy areas of low-attenuation change in the deep white matter consistent with small vessel ischemia. No mass-effect or midline shift. No abnormal extra-axial fluid collections. Gray-white matter junctions are distinct. Basal cisterns are not effaced. No acute intracranial hemorrhage.   Vascular: Intracranial arterial vascular calcifications are present.   Skull: Calvarium appears intact.   Sinuses/Orbits: Paranasal sinuses and mastoid air cells are clear.   Other: None.   IMPRESSION: No acute intracranial abnormalities. Chronic atrophy and small vessel ischemic changes.  Personally reviewed and agree with above NP visits: UPDATE 05/21/2018CM John Sheppard, 81 year old male returns for follow-up with history of transient vision changes. MRI of the brain 05/25/2015 with mild scattered periventricular and subcortical chronic small vessel ischemic disease. No acute findings. MRA of the head No focal stenosis or occlusions noted.  Normal variant branch pattern of intracranial circulation. Specifically, the posterior circulation is hypoplastic and the bilateral posterior  cerebral arteries arise from the anterior circulation. Also, the bilateral anterior cerebral arteries arise primarily from the left sided  circulation. Carotid Doppler was negative for significant stenosis. He continues to see [name],  Cardiology patient has history of ablation for atrial fibrillation. He remains on Plavix with no further stroke or TIA symptoms. He walks a mile every day and  does flexibility exercises. Blood pressure in the office today 136/74. He remains on pravachol for his hyperlipidemia without complaints of myalgias. He returns for reevaluation  HISTORY 05/12/15 John Sheppard. is a 81 y.o. male here as a referral from Dr. Philip Aspen and [name] for vision changes. Past medical history of atrial fibrillation, atrial flutter, status post ablation and cardioversion, macular degeneration, hypothyroidism. He has had 2 ablations and does not have afib anymore. He has had 4 episodes of transient vision changes over the last 4-5 years with vision changes in the right eye, gets blurry, he has trouble remembering names in the room, goes on for 4-10 minutes. No known triggers. Two of these have occurred in the last 4-6 weeks. Last episode was a week ago. When symptoms occur, he can follow the conversation. Last happened a week ago. He has trouble reading during the episodes because of his vision loss. He also has macular degeneration of the right eye. He is scheduled for an ESI on Monday for LBP. No other associated symptoms with the blurred vision(no headache) except for slight confusion. When it would happen he would say his ABCs, he could say the ABCs and there was no loss of awareness just blurry periphery in the right eye only. He goes to Dr. Nolon Stalls for ophthalmology and he has not mentioned it to her yet. No other associated symptoms with episodes, not lightheaded, not dizzy, no SOB, no CP, not diaphoretic, no headaches. Episodes last 4-5 minutes. Blurriness with lines in it. No history of migraines. No associated headache. No associated numbness or tingling. He has been in a room full of people and no one notices anything different. No other focal neurologic symptoms. He is currently just on aspirin, he discontinued warfarin after his ablation.   HPI 05/12/2015 Dr Jaynee Eagles:  John Sheppard. is a 81 y.o. male here as a referral from Dr. Stark Jock and [name] for vision  changes. Past medical history of atrial fibrillation, atrial flutter, status post ablation and cardioversion, macular degeneration, hypothyroidism. He has had 2 ablations and does not have afib anymore. He has had 4 episodes of transient vision changes over the last 4-5 years with vision changes in the right eye, gets blurry, he has trouble remembering names in the room, goes on for 4-10 minutes. No known triggers. Two of these have occurred in the last 4-6 weeks. Last episode was a week ago. When symptoms occur, he can follow the conversation. Last happened a week ago. He has trouble reading during the episodes because of his vision loss. He also has macular degeneration of the right eye. He is scheduled for an ESI on Monday for LBP. No other associated symptoms with the blurred vision(no headache) except for slight confusion. When it would happen he would say his ABCs, he could say the ABCs and there was no loss of awareness just blurry periphery in the right eye only. He goes to Dr. Nolon Stalls for ophthalmology and he has not mentioned it to her yet. No other associated symptoms with episodes, not lightheaded, not dizzy, no SOB, no CP, not diaphoretic, no headaches. Episodes last 4-5 minutes. Blurriness with lines in it. No history of migraines. No associated headache. No associated numbness or tingling. He  has been in a room full of people and no one notices anything different. No other focal neurologic symptoms. He is currently just on aspirin, he discontinued warfarin after his ablation.  Reviewed notes, labs and imaging from outside physicians, which showed: Patient follows with [name]. He denies palpitations, chest pain, shortness of breath, lower extremity edema, dizziness, presyncope or syncope. He does have previous history of atrial fibrillation and atrial flutter status post ablation and cardioversion. His A. fib is well controlled status post ablation. He is on sotalol. He wanted to. He is  asymptomatic bradycardia. Chad2vasc score is 1. He is on aspirin.  Review of Systems: Patient complains of symptoms per HPI as well as the following symptoms: Aching muscles and left femoral nerve. Pertinent negatives per HPI. All others negative.   Social History   Socioeconomic History   Marital status: Married    Spouse name: [name]   Number of children: 2   Years of education: 16   Highest education level: Not on file  Occupational History   Occupation: Retired  Tobacco Use   Smoking status: Never   Smokeless tobacco: Never  Vaping Use   Vaping Use: Never used  Substance and Sexual Activity   Alcohol use: Yes    Alcohol/week: 2.0 standard drinks of alcohol    Types: 2 Cans of beer per week   Drug use: Never   Sexual activity: Yes  Other Topics Concern   Not on file  Social History Narrative   Lives w/ wife   Caffeine use:  Drinks tea/diet pepsi daily   Social Determinants of Health   Financial Resource Strain: Not on file  Food Insecurity: Not on file  Transportation Needs: Not on file  Physical Activity: Not on file  Stress: Not on file  Social Connections: Not on file  Intimate Partner Violence: Not on file    Family History  Problem Relation Age of Onset   Alzheimer's disease Mother    Heart failure Father        DECEASED   Stroke Sister    Alzheimer's disease Maternal Grandfather    Colon cancer Neg Hx    Stomach cancer Neg Hx     Past Medical History:  Diagnosis Date   Atrial flutter ([place])    s/p CTI ablation 10/20/09   Chronic lower back pain    Coronary artery disease    Dry age-related macular degeneration of right eye    Hay fever    High cholesterol    Hypertension    Hyperthyroidism    "never treated for this" (08/20/2017)   Paroxysmal atrial fibrillation ([place])    s/p PVI 10/20/09, 10/19/10   Pneumonia 1992    Past Surgical History:  Procedure Laterality Date   ATRIAL ABLATION SURGERY  10/20/2009; 10/19/2010   PVI and CTI ablation by [facility]     CARDIOVERSION  2001   CATARACT EXTRACTION W/ INTRAOCULAR LENS  IMPLANT, BILATERAL Bilateral 08/2010   COLONOSCOPY     "~ 6 month after 1st colonoscopy; no polyps 2nd time"   COLONOSCOPY W/ BIOPSIES AND POLYPECTOMY     CORONARY STENT INTERVENTION N/A 08/20/2017   Procedure: CORONARY STENT INTERVENTION;  Surgeon: [name], MD;  Location: [facility];  Service: Cardiovascular;  Laterality: N/A;   RIGHT/LEFT HEART CATH AND CORONARY ANGIOGRAPHY N/A 08/20/2017   Procedure: RIGHT/LEFT HEART CATH AND CORONARY ANGIOGRAPHY;  Surgeon: [name], MD;  Location: [facility];  Service: Cardiovascular;  Laterality:  N/A;   TRANSESOPHAGEAL ECHOCARDIOGRAM  10/2009   EF: 60-65%    Current Outpatient Medications  Medication Sig Dispense Refill   aspirin 81 MG chewable tablet Chew 1 tablet (81 mg total) by mouth daily. 90 tablet 3   Cyanocobalamin (B-12) 2500 MCG TABS Take 1 tablet by mouth daily.     ferrous sulfate 325 (65 FE) MG tablet Take 325 mg by mouth daily with breakfast.     gabapentin (NEURONTIN) 300 MG capsule Take 1 capsule by mouth daily.     lisinopril (ZESTRIL) 10 MG tablet TAKE 1 TABLET AS NEEDED FOR ELEVATED BLOOD PRESSURE 90 tablet 1   meclizine (ANTIVERT) 25 MG tablet Take 1 tablet by mouth as needed for dizziness.     Multiple Vitamin (MULTIVITAMIN) tablet Take 1 tablet by mouth daily.       Multiple Vitamins-Minerals (PRESERVISION/LUTEIN PO) Take 1 capsule by mouth 2 (two) times daily.      Omega-3 Fatty Acids (FISH OIL) 1000 MG CPDR Take 1 capsule by mouth daily.      rosuvastatin (CRESTOR) 20 MG tablet Take 1 tablet by mouth daily.     sotalol (BETAPACE) 160 MG tablet Take 0.5 tablets (80 mg total) by mouth 2 (two) times daily. 90 tablet 2   nitroGLYCERIN (NITROSTAT) 0.4 MG SL tablet Place 1 tablet (0.4 mg total) under the tongue every 5 (five) minutes as needed for chest pain. 25 tablet 3   No current facility-administered medications for  this visit.    Allergies as of 09/27/2021   (Not on File)    Vitals: BP (!) 144/77   Pulse (!) 47   Ht '5\' 10"'$  (1.778 m)   Wt 182 lb (82.6 kg)   BMI 26.11 kg/m  Last Weight:  Wt Readings from Last 1 Encounters:  09/27/21 182 lb (82.6 kg)   Last Height:   Ht Readings from Last 1 Encounters:  09/27/21 '5\' 10"'$  (1.778 m)  Physical exam: Exam: Gen: NAD, conversant, well nourised, well groomed                     CV: RRR, no MRG. No Carotid Bruits. No peripheral edema, warm, nontender Eyes: Conjunctivae clear without exudates or hemorrhage  Neuro: Detailed Neurologic Exam  Speech:    Speech is normal; fluent and spontaneous with normal comprehension.  Cognition:    The patient is oriented to person, place, and time;     recent and remote memory intact;     language fluent;     normal attention, concentration,     fund of knowledge Cranial Nerves:    The pupils are equal, round, and reactive to light.  Pupils too small to visualize fundi attempted.  Extraocular movements are intact. Trigeminal sensation is intact and the muscles of mastication are normal. The face is symmetric. The palate elevates in the midline. Hearing intact. Voice is normal. Shoulder shrug is normal. The tongue has normal motion without fasciculations.   Coordination:    Normal   Gait:    Heel-toe and tandem gait are normal.   Motor Observation:    No asymmetry, no atrophy, and no involuntary movements noted. Tone:    Normal muscle tone.    Posture:    Posture is normal. normal erect    Strength:    Strength is V/V in the upper and lower limbs.      Sensation: intact to LT     Reflex Exam:  DTR's:    Deep  tendon reflexes in the upper and lower extremities are symmetrical bilaterally.   Toes:    The toes are downgoing bilaterally.   Clonus:    Clonus is absent.      Assessment/Plan:  31 81 year old here for follow up after ED visit. I reviewed emergency room notes, on 09/05/2021 he  presented with complaints of weakness headache and having difficulty and per putting time on his watch.  Also forgetfulness in the waiting room.  Work-up was unremarkable including EKG, laboratory studies and CT scan of the head, by the time he was examined he appeared to have no neurologic deficits.He has had several ablations, very rarely now with afib on asa and not on [facility] after several ablations and sotalol. He had an episode of AMS for several hours, he had afib on Sunday (woke up with it) and played golf and 2 days later he was reading and his vision became fuzzy around the edges on the left like rainfall in the corner of the eye if he moved his eye it went away, he was reading a book and he was having difficulty reading, he could read the word but couldn't understand the sentence, he wasn't feeling well that day all day and at 730pm he told his wife he needs to go to the emergency room and he had a headache, possibly shortness or breath unclear if anxiety. In the waiting room he couldn't tell time or the clock on the wall not because of vision he could see the numbers but couldn;t tell wife what time it was on a manual clock. He felt nauseated. Headache on the right, a pain, ran down to the jaw, not pulsating or pounding, no light or sound sensitivity. He had some episodes of transient vision changes in 2017 but this was different. I'm concerned for TIA vs stroke although migraine aura is in the differential   - Patient has afib paroxysmal and having episodes of TIA vs stroke vs migraine with aura not on [facility] - Given his episode we did talk about starting Eliquis which he is hesitant to start but I feel there risks outweigh the benefits. we discussed that there is some literature on taking eliquis only when having afib.  - MRI of the brain and MRA of the head for suspected stroke or TIA - Reached out to [name] and ask him to repeat carotid dopplers and echocardiogram with bubble study or TEE or I am  happy to order as well and to get Dr. Jackalyn Lombard opinion.   I had a long d/w patient about his recent possible TIA, risk for stroke/recurrent TIAs. Continue ASA although I recommend Eliquis for embolic stroke prevention and maintain strict control of hypertension with blood pressure goal below 130/90, diabetes with hemoglobin A1c goal below 6.5% and lipids with LDL cholesterol goal below 70 mg/dL. I also advised the patient to eat a healthy diet with plenty of whole grains, lean protein, fruits and vegetables, exercise regularly and maintain ideal body weight .Followup in the future with me in  6 months or call earlier if necessary.  Sarina Ill, MD  [facility] [address] [place], [place] [address]  Phone [phone] Fax [phone]

## 2021-09-27 NOTE — Patient Instructions (Addendum)
MRI of the brain and MRA of the head for suspected stroke  Reach out to [name] and ask him to repeat carotid dopplers and echocardiogram with bubble study or TEE Migaine with aura?    Atrial Fibrillation  Atrial fibrillation is a type of irregular or rapid heartbeat (arrhythmia). In atrial fibrillation, the top part of the heart (atria) beats in an irregular pattern. This makes the heart unable to pump blood normally and effectively. The goal of treatment is to prevent blood clots from forming, control your heart rate, or restore your heartbeat to a normal rhythm. If this condition is not treated, it can cause serious problems, such as a weakened heart muscle (cardiomyopathy) or a stroke. What are the causes? This condition is often caused by medical conditions that damage the heart's electrical system. These include: High blood pressure (hypertension). This is the most common cause. Certain heart problems or conditions, such as heart failure, coronary artery disease, heart valve problems, or heart surgery. Diabetes. Overactive thyroid (hyperthyroidism). Obesity. Chronic kidney disease. In some cases, the cause of this condition is not known. What increases the risk? This condition is more likely to develop in: Older people. People who smoke. Athletes who do endurance exercise. People who have a family history of atrial fibrillation. Men. People who use drugs. People who drink a lot of alcohol. People who have lung conditions, such as emphysema, pneumonia, or COPD. People who have obstructive sleep apnea. What are the signs or symptoms? Symptoms of this condition include: A feeling that your heart is racing or beating irregularly. Discomfort or pain in your chest. Shortness of breath. Sudden light-headedness or weakness. Tiring easily during exercise or activity. Fatigue. Syncope (fainting). Sweating. In some cases, there are no symptoms. How is this diagnosed? Your  health care provider may detect atrial fibrillation when taking your pulse. If detected, this condition may be diagnosed with: An electrocardiogram (ECG) to check electrical signals of the heart. An ambulatory cardiac monitor to record your heart's activity for a few days. A transthoracic echocardiogram (TTE) to create pictures of your heart. A transesophageal echocardiogram (TEE) to create even closer pictures of your heart. A stress test to check your blood supply while you exercise. Imaging tests, such as a CT scan or chest X-ray. Blood tests. How is this treated? Treatment depends on underlying conditions and how you feel when you experience atrial fibrillation. This condition may be treated with: Medicines to prevent blood clots or to treat heart rate or heart rhythm problems. Electrical cardioversion to reset the heart's rhythm. A pacemaker to correct abnormal heart rhythm. Ablation to remove the heart tissue that sends abnormal signals. Left atrial appendage closure to seal the area where blood clots can form. In some cases, underlying conditions will be treated. Follow these instructions at home: Medicines Take over-the counter and prescription medicines only as told by your health care provider. Do not take any new medicines without talking to your health care provider. If you are taking blood thinners: Talk with your health care provider before you take any medicines that contain aspirin or NSAIDs, such as ibuprofen. These medicines increase your risk for dangerous bleeding. Take your medicine exactly as told, at the same time every day. Avoid activities that could cause injury or bruising, and follow instructions about how to prevent falls. Wear a medical alert bracelet or carry a card that lists what medicines you take. Lifestyle     Do not use any products that contain nicotine or tobacco,  such as cigarettes, e-cigarettes, and chewing tobacco. If you need help quitting, ask  your health care provider. Eat heart-healthy foods. Talk with a dietitian to make an eating plan that is right for you. Exercise regularly as told by your health care provider. Do not drink alcohol. Lose weight if you are overweight. Do not use drugs, including cannabis. General instructions If you have obstructive sleep apnea, manage your condition as told by your health care provider. Do not use diet pills unless your health care provider approves. Diet pills can make heart problems worse. Keep all follow-up visits as told by your health care provider. This is important. Contact a health care provider if you: Notice a change in the rate, rhythm, or strength of your heartbeat. Are taking a blood thinner and you notice more bruising. Tire more easily when you exercise or do heavy work. Have a sudden change in weight. Get help right away if you have:  Chest pain, abdominal pain, sweating, or weakness. Trouble breathing. Side effects of blood thinners, such as blood in your vomit, stool, or urine, or bleeding that cannot stop. Any symptoms of a stroke. "BE FAST" is an easy way to remember the main warning signs of a stroke: B - Balance. Signs are dizziness, sudden trouble walking, or loss of balance. E - Eyes. Signs are trouble seeing or a sudden change in vision. F - Face. Signs are sudden weakness or numbness of the face, or the face or eyelid drooping on one side. A - Arms. Signs are weakness or numbness in an arm. This happens suddenly and usually on one side of the body. S - Speech. Signs are sudden trouble speaking, slurred speech, or trouble understanding what people say. T - Time. Time to call emergency services. Write down what time symptoms started. Other signs of a stroke, such as: A sudden, severe headache with no known cause. Nausea or vomiting. Seizure. These symptoms may represent a serious problem that is an emergency. Do not wait to see if the symptoms will go away. Get  medical help right away. Call your local emergency services (911 in the [place] Do not drive yourself to the hospital. Summary Atrial fibrillation is a type of irregular or rapid heartbeat (arrhythmia). Symptoms include a feeling that your heart is beating fast or irregularly. You may be given medicines to prevent blood clots or to treat heart rate or heart rhythm problems. Get help right away if you have signs or symptoms of a stroke. Get help right away if you cannot catch your breath or have chest pain or pressure. This information is not intended to replace advice given to you by your health care provider. Make sure you discuss any questions you have with your health care provider. Document Revised: 07/22/2018 Document Reviewed: 07/22/2018 Elsevier Patient Education  [employer].

## 2021-10-02 ENCOUNTER — Telehealth: Payer: Self-pay | Admitting: Neurology

## 2021-10-02 NOTE — Telephone Encounter (Signed)
Mcarthur Rossetti Josem Kaufmann: [id number] exp. 10/02/21-11/01/21 sent to GI

## 2021-10-03 ENCOUNTER — Telehealth: Payer: Self-pay

## 2021-10-03 DIAGNOSIS — I480 Paroxysmal atrial fibrillation: Secondary | ICD-10-CM

## 2021-10-03 DIAGNOSIS — G459 Transient cerebral ischemic attack, unspecified: Secondary | ICD-10-CM

## 2021-10-03 NOTE — Telephone Encounter (Signed)
-----   Message from Thompson Grayer, MD sent at 10/02/2021  2:13 PM EDT ----- Regarding: RE: mutual patient Thanks for reaching out.  I agree with starting eliquis.  Merrilee Seashore, please order carotid dopplers and an echo.  Thanks!  ----- Message ----- From: Melvenia Beam, MD Sent: 09/30/2021   9:45 AM EDT To: Thompson Grayer, MD Subject: mutual patient                                 Hi [name], I am sending a message about a mutual patient I'm concerned about.  He has paroxysmal A-fib with several ablations on sotalol but not on anticoagulation.  He had a concerning episode at the end of July that I think was either a TIA or stroke.  He endorsed an episode of A-fib and 2 days prior of his symptoms that sound like may have been embolic phenomena.  He is resistant to start anticoagulation although I told him that is what I would recommend at this time.  I think the episode was either a TIA or stroke, I cannot rule out migraine with aura since he has had what sounds like ocular migraines in the past but I am concerned about something more sinister.  I am going to order an MRI of the brain and an MRA of the head.  I wanted to also consider carotid Dopplers and echocardiogram but I wanted to reach out to you first and see if you wanted to order them.  I do feel that taking Eliquis is worth the risk of slightly increased bleeding, there is some literature as you know on taking Eliquis only when taking A-fib if he knows when he is having A-fib.  I wanted to let you know and order an MRI of the head and an MRA of the head, let me know if I should order the echo and carotid Dopplers or if you would like to.  I also love to hear your opinion on this.  I hope Ulice Dash is doing well, regards Vivien Rota

## 2021-10-03 NOTE — Telephone Encounter (Signed)
Per Dr. Clearnce Hasten message below, a Carotid doppler and an Echocardiogram complete order has been entered into Epic.    Pt will be scheduled with EP MD on [facility] to follow up in 1 Month to discuss results and possibly start Eliquis; Per Dr Jaynee Eagles and Dr. Jackalyn Lombard request.     Pt called spouse answered the phone.  Mrs. Coldwell agreed with Dr. Jackalyn Lombard orders, and was told that we will be reaching out to schedule these appointments after precert is complete.  Will also schedule a f/u appointment with an EP Doctor.  Message sent to precert and scheduling.

## 2021-10-07 ENCOUNTER — Ambulatory Visit
Admission: RE | Admit: 2021-10-07 | Discharge: 2021-10-07 | Disposition: A | Discharge status: Home / Self Care | Payer: Medicare HMO | Source: Ambulatory Visit | Attending: Neurology | Admitting: Neurology

## 2021-10-07 DIAGNOSIS — H539 Unspecified visual disturbance: Secondary | ICD-10-CM

## 2021-10-07 DIAGNOSIS — R419 Unspecified symptoms and signs involving cognitive functions and awareness: Secondary | ICD-10-CM

## 2021-10-07 DIAGNOSIS — G459 Transient cerebral ischemic attack, unspecified: Secondary | ICD-10-CM

## 2021-10-07 DIAGNOSIS — Z87898 Personal history of other specified conditions: Secondary | ICD-10-CM

## 2021-10-10 ENCOUNTER — Other Ambulatory Visit: Payer: Medicare HMO

## 2021-10-12 ENCOUNTER — Ambulatory Visit (HOSPITAL_COMMUNITY): Payer: Medicare HMO | Attending: Internal Medicine

## 2021-10-12 ENCOUNTER — Telehealth: Payer: Self-pay | Admitting: *Deleted

## 2021-10-12 DIAGNOSIS — G459 Transient cerebral ischemic attack, unspecified: Secondary | ICD-10-CM

## 2021-10-12 DIAGNOSIS — I480 Paroxysmal atrial fibrillation: Secondary | ICD-10-CM

## 2021-10-12 NOTE — Telephone Encounter (Signed)
Spoke with patient and informed him per Dr Jaynee Eagles he had a stroke, we discussed Eliquis and [name] also agreed. He is welcome to follow up with Dr Jaynee Eagles, but she highly recommends eliquis especially since he really did have a stroke right fronto-parietal. With his afib and a stroke he should be on eliquis. He will FU with Dr Jaynee Eagles as scheduled, and with Dr Rayann Heman. Patient verbalized understanding, appreciation.

## 2021-10-12 NOTE — Telephone Encounter (Signed)
Called wife and answered her questions to her stated satisfaction.

## 2021-10-12 NOTE — Telephone Encounter (Signed)
Pt wife

## 2021-10-12 NOTE — Telephone Encounter (Signed)
Pt wife [name]) is requesting a nurse give her a call. [name] stated Pt is hard of hearing and didn't understand what nurse was telling him when she called. [name] is requesting a nurse call her at [phone].

## 2021-10-18 ENCOUNTER — Ambulatory Visit (HOSPITAL_COMMUNITY)
Admission: RE | Admit: 2021-10-18 | Discharge: 2021-10-18 | Disposition: A | Discharge status: Home / Self Care | Payer: Medicare HMO | Source: Ambulatory Visit | Attending: Internal Medicine | Admitting: Internal Medicine

## 2021-10-18 ENCOUNTER — Telehealth: Payer: Self-pay | Admitting: Cardiovascular Disease

## 2021-10-18 ENCOUNTER — Ambulatory Visit (HOSPITAL_COMMUNITY): Payer: Medicare HMO

## 2021-10-18 DIAGNOSIS — I480 Paroxysmal atrial fibrillation: Secondary | ICD-10-CM | POA: Diagnosis not present

## 2021-10-18 DIAGNOSIS — G459 Transient cerebral ischemic attack, unspecified: Secondary | ICD-10-CM | POA: Diagnosis not present

## 2021-10-18 NOTE — Telephone Encounter (Signed)
Patient's wife called stating the neurologist Dr. Lavell Anchors recommended patient start taking Eliquis.  Dr. Lavell Anchors stated patient call us to prescribe the Eliquis to him.  Patient recently had a stroke that was confirmed by an MRI.

## 2021-10-18 NOTE — Telephone Encounter (Signed)
Called patient's wife (DPR) with Jonni Sanger Tillery's recommendation. Patient's wife is fine with this. Will send message to [facility]. FIB clinic and put in referral since he has not been there before.   Shirley Friar, PA-C  You 27 minutes ago (10:39 AM)   He should have a visit and discussion to start this.   Would have him seen in AF clinic to consider starting. We haven't seen the patient in some time.

## 2021-10-23 ENCOUNTER — Ambulatory Visit (HOSPITAL_COMMUNITY)
Admission: RE | Admit: 2021-10-23 | Discharge: 2021-10-23 | Disposition: A | Discharge status: Home / Self Care | Payer: Medicare HMO | Source: Ambulatory Visit | Attending: Physician Assistant | Admitting: Physician Assistant

## 2021-10-23 ENCOUNTER — Encounter (HOSPITAL_COMMUNITY): Payer: Self-pay | Admitting: Physician Assistant

## 2021-10-23 VITALS — BP 140/86 | HR 55 | Ht 70.0 in | Wt 183.8 lb

## 2021-10-23 DIAGNOSIS — Z7982 Long term (current) use of aspirin: Secondary | ICD-10-CM | POA: Diagnosis not present

## 2021-10-23 DIAGNOSIS — D6869 Other thrombophilia: Secondary | ICD-10-CM | POA: Diagnosis not present

## 2021-10-23 DIAGNOSIS — E0590 Thyrotoxicosis, unspecified without thyrotoxic crisis or storm: Secondary | ICD-10-CM | POA: Insufficient documentation

## 2021-10-23 DIAGNOSIS — I2510 Atherosclerotic heart disease of native coronary artery without angina pectoris: Secondary | ICD-10-CM | POA: Insufficient documentation

## 2021-10-23 DIAGNOSIS — Z7901 Long term (current) use of anticoagulants: Secondary | ICD-10-CM | POA: Insufficient documentation

## 2021-10-23 DIAGNOSIS — I10 Essential (primary) hypertension: Secondary | ICD-10-CM | POA: Insufficient documentation

## 2021-10-23 DIAGNOSIS — I4892 Unspecified atrial flutter: Secondary | ICD-10-CM | POA: Insufficient documentation

## 2021-10-23 DIAGNOSIS — I480 Paroxysmal atrial fibrillation: Secondary | ICD-10-CM | POA: Diagnosis not present

## 2021-10-23 DIAGNOSIS — I639 Cerebral infarction, unspecified: Secondary | ICD-10-CM | POA: Insufficient documentation

## 2021-10-23 MED ORDER — APIXABAN 5 MG PO TABS: 5.0000 mg | ORAL_TABLET | Freq: Two times a day (BID) | ORAL | 2 refills | Status: DC

## 2021-10-23 NOTE — Progress Notes (Signed)
Primary Care Physician: [name], MD Primary Cardiologist: none Primary Electrophysiologist: Dr Allred/Dr Myles Gip (new) Referring Physician: Oda Kilts PA   [name] is a 81 y.o. male with a history of HTN, hyperthyroid, CAD, CVA, atrial flutter, atrial fibrillation who presents for follow up in the [facility].  The patient was initially diagnosed with atrial fibrillation remotely and had an afib and flutter ablation in 2011 and repeat ablation in 2012. He has been maintained on sotalol. Patient has a CHADS2VASC score of 6 but has historically declined [place]. Patient was seen at the ED 09/05/21 for weakness, headache, and having difficulty interpreting time on his watch. CT of head was unremarkable. He saw Dr Jaynee Eagles of neurology as an outpatient who ordered an MRI which showed right frontal parietal CVA. Today, patient reports that he feels well with infrequent episodes of brief afib.   Today, he denies symptoms of palpitations, chest pain, shortness of breath, orthopnea, PND, lower extremity edema, dizziness, presyncope, syncope, snoring, daytime somnolence, bleeding, or neurologic sequela. The patient is tolerating medications without difficulties and is otherwise without complaint today.    Atrial Fibrillation Risk Factors:  he does not have symptoms or diagnosis of sleep apnea. he does not have a history of rheumatic fever.   he has a BMI of Body mass index is 26.37 kg/m.Marland Kitchen Filed Weights   10/23/21 0916  Weight: 83.4 kg    Family History  Problem Relation Age of Onset   Alzheimer's disease Mother    Heart failure Father        DECEASED   Stroke Sister    Alzheimer's disease Maternal Grandfather    Colon cancer Neg Hx    Stomach cancer Neg Hx      Atrial Fibrillation Management history:  Previous antiarrhythmic drugs: sotalol  Previous cardioversions: 2001 Previous ablations: 2011 fib and flutter, 2012 CHADS2VASC score:  6 Anticoagulation history: warfarin   Past Medical History:  Diagnosis Date   Atrial flutter ([place])    s/p CTI ablation 10/20/09   Chronic lower back pain    Coronary artery disease    Dry age-related macular degeneration of right eye    Hay fever    High cholesterol    Hypertension    Hyperthyroidism    "never treated for this" (08/20/2017)   Paroxysmal atrial fibrillation ([place])    s/p PVI 10/20/09, 10/19/10   Pneumonia 1992   Past Surgical History:  Procedure Laterality Date   ATRIAL ABLATION SURGERY  10/20/2009; 10/19/2010   PVI and CTI ablation by [facility]    CARDIOVERSION  2001   CATARACT EXTRACTION W/ INTRAOCULAR LENS  IMPLANT, BILATERAL Bilateral 08/2010   COLONOSCOPY     "~ 6 month after 1st colonoscopy; no polyps 2nd time"   COLONOSCOPY W/ BIOPSIES AND POLYPECTOMY     CORONARY STENT INTERVENTION N/A 08/20/2017   Procedure: CORONARY STENT INTERVENTION;  Surgeon: [name], MD;  Location: [facility];  Service: Cardiovascular;  Laterality: N/A;   RIGHT/LEFT HEART CATH AND CORONARY ANGIOGRAPHY N/A 08/20/2017   Procedure: RIGHT/LEFT HEART CATH AND CORONARY ANGIOGRAPHY;  Surgeon: [name], MD;  Location: [facility];  Service: Cardiovascular;  Laterality: N/A;   TRANSESOPHAGEAL ECHOCARDIOGRAM  10/2009   EF: 60-65%    Current Outpatient Medications  Medication Sig Dispense Refill   aspirin 81 MG chewable tablet Chew 1 tablet (81 mg total) by mouth daily. 90 tablet 3   Cyanocobalamin (B-12) 2500 MCG  TABS Take 1 tablet by mouth daily.     ferrous sulfate 325 (65 FE) MG tablet Take 325 mg by mouth daily with breakfast.     gabapentin (NEURONTIN) 300 MG capsule Take 1 capsule by mouth daily.     lisinopril (ZESTRIL) 10 MG tablet TAKE 1 TABLET AS NEEDED FOR ELEVATED BLOOD PRESSURE 90 tablet 1   meclizine (ANTIVERT) 25 MG tablet Take 1 tablet by mouth as needed for dizziness.     Multiple Vitamin (MULTIVITAMIN) tablet Take 1 tablet by mouth daily.        Multiple Vitamins-Minerals (PRESERVISION/LUTEIN PO) Take 1 capsule by mouth 2 (two) times daily.      nitroGLYCERIN (NITROSTAT) 0.4 MG SL tablet Place 1 tablet (0.4 mg total) under the tongue every 5 (five) minutes as needed for chest pain. 25 tablet 3   Omega-3 Fatty Acids (FISH OIL) 1000 MG CPDR Take 1 capsule by mouth daily.      rosuvastatin (CRESTOR) 20 MG tablet Take 1 tablet by mouth daily.     sotalol (BETAPACE) 160 MG tablet Take 0.5 tablets (80 mg total) by mouth 2 (two) times daily. 90 tablet 2   No current facility-administered medications for this encounter.    No Known Allergies  Social History   Socioeconomic History   Marital status: Married    Spouse name: [name]   Number of children: 2   Years of education: 16   Highest education level: Not on file  Occupational History   Occupation: Retired  Tobacco Use   Smoking status: Never   Smokeless tobacco: Never   Tobacco comments:    Never smoke 10/23/21  Vaping Use   Vaping Use: Never used  Substance and Sexual Activity   Alcohol use: Yes    Alcohol/week: 2.0 standard drinks of alcohol    Types: 2 Cans of beer per week    Comment: beer occ. 10/23/21   Drug use: Never   Sexual activity: Yes  Other Topics Concern   Not on file  Social History Narrative   Lives w/ wife   Caffeine use:  Drinks tea/diet pepsi daily   Social Determinants of Health   Financial Resource Strain: Not on file  Food Insecurity: Not on file  Transportation Needs: Not on file  Physical Activity: Not on file  Stress: Not on file  Social Connections: Not on file  Intimate Partner Violence: Not on file     ROS- All systems are reviewed and negative except as per the HPI above.  Physical Exam: Vitals:   10/23/21 0916  BP: (!) 140/86  Pulse: (!) 55  Weight: 83.4 kg  Height: '5\' 10"'$  (1.778 m)    GEN- The patient is a well appearing elderly male, alert and oriented x 3 today.   Head- normocephalic, atraumatic Eyes-  Sclera  clear, conjunctiva pink Ears- hearing intact Oropharynx- clear Neck- supple  Lungs- Clear to ausculation bilaterally, normal work of breathing Heart- Regular rate and rhythm, no murmurs, rubs or gallops  GI- soft, NT, ND, + BS Extremities- no clubbing, cyanosis, or edema MS- no significant deformity or atrophy Skin- no rash or lesion Psych- euthymic mood, full affect Neuro- strength and sensation are intact  Wt Readings from Last 3 Encounters:  10/23/21 83.4 kg  09/27/21 82.6 kg  09/04/21 84.4 kg    EKG today demonstrates  SB, 1st degree AV block Vent. rate 55 BPM PR interval 212 ms QRS duration 88 ms QT/QTcB 436/417 ms  Echo 06/30/17 demonstrated  Left ventricle: The cavity size was normal. Wall thickness was    increased in a pattern of mild LVH. Systolic function was normal.    The estimated ejection fraction was in the range of 55% to 60%.    Wall motion was normal; there were no regional wall motion    abnormalities. Doppler parameters are consistent with abnormal    left ventricular relaxation (grade 1 diastolic dysfunction).  - Aortic valve: There was trivial regurgitation.   Impressions:   - Normal LV systolic function; mild LVH; mild diastolic    dysfunction; trace AI and MR.   Epic records are reviewed at length today  CHA2DS2-VASc Score = 6  The patient's score is based upon: CHF History: 0 HTN History: 1 Diabetes History: 0 Stroke History: 2 Vascular Disease History: 1 Age Score: 2 Gender Score: 0       ASSESSMENT AND PLAN: 1. Paroxysmal Atrial Fibrillation/atrial flutter The patient's CHA2DS2-VASc score is 6, indicating a 9.7% annual risk of stroke.   S/p afib and flutter ablation 2011, repeat ablation 2012 Patient in [place] today. We discussed his stroke risk and the risks and benefits of anticoagulation. Will start Eliquis 5 mg BID (labs from ED reviewed) Continue ASA 81 mg daily with h/o DES to LAD Continue sotalol 80 mg BID  2. Secondary  Hypercoagulable State (ICD10:  D68.69) The patient is at significant risk for stroke/thromboembolism based upon his CHA2DS2-VASc Score of 6.  Start Apixaban (Eliquis).   3. CAD No anginal symptoms.  4. HTN Stable, no changes today.   Follow up with Dr Myles Gip as scheduled.    [facility] [address] [place], [place] [address] [phone] 10/23/2021 9:26 AM

## 2021-10-23 NOTE — Patient Instructions (Signed)
Start Eliquis 5mg twice a day 

## 2021-11-08 ENCOUNTER — Encounter: Payer: Self-pay | Admitting: Neurology

## 2021-11-08 ENCOUNTER — Ambulatory Visit: Payer: Medicare HMO | Admitting: Neurology

## 2021-11-08 VITALS — BP 144/75 | HR 50 | Ht 72.0 in | Wt 182.2 lb

## 2021-11-08 DIAGNOSIS — I480 Paroxysmal atrial fibrillation: Secondary | ICD-10-CM

## 2021-11-08 DIAGNOSIS — Z820 Family history of epilepsy and other diseases of the nervous system: Secondary | ICD-10-CM

## 2021-11-08 DIAGNOSIS — I63411 Cerebral infarction due to embolism of right middle cerebral artery: Secondary | ICD-10-CM | POA: Insufficient documentation

## 2021-11-08 NOTE — Progress Notes (Signed)
[facility]    Provider:  Dr John Sheppard Referring Provider: [name], MD Primary Care Physician:  John Lopes, MD  CC:  Transient monocular vision changes, confusion, episodes of AMS, paroxysmal afib not on [facility]  11/08/2021: MRI showed a stroke, patient was placed on eliquis due to afib likely causing it. We reviewed today. We discussed eliquis.    Review images with patient today: IMPRESSION: This MRI of the brain without contrast shows the following: Right frontal parietal CVA associated with heme products and curvilinear T1 hyperintensity.  This is consistent with a CVA 2 to 6 weeks ago with associated hemorrhagic conversion and laminar necrosis. T2/FLAIR hypertense foci consistent with chronic microvascular ischemic change, mildly progressed compared to the 2017 MRI Mild generalized cortical atrophy, typical for age Normal variant posterior circulation anatomy with diminutive basilar artery and prominent posterior communicating arteries. Incidental note of left petrous apex cephalocele, unchanged compared to the 2017 MRI.  Patient complains of symptoms per HPI as well as the following symptoms: stroke . Pertinent negatives and positives per HPI. All others negative     09/27/2021: Patient here for follow up after ED visit. I reviewed emergency room notes, on 09/05/2021 he presented with complaints of weakness headache and having difficulty and per putting time on his watch.  Also forgetfulness in the waiting room.  Work-up was unremarkable including EKG, laboratory studies and T CT scan of the head, by the time he was examined he appeared to have no neurologic deficits.  Patient has afib paroxysmal and having episodes of TIA vs stroke vs migraine with aura: Patient is here with his wife, we discussed that there is some literature on taking eliquis only when having afib. He has had several ablations, very rarely now with afib on asa and not on AC. He had an  episode of AMS for several hours, he had afib on Sunday (woke up with it) and played golf and 2 days later he was reading and his vision became fuzzy around the edges on the left like rainfall in the corner of the eye if he moved his eye it went away, he was reading a book and he was having difficulty reading, he could read the word but couldn;t understand the sentence, he wasn't feeling well that day all day and at 730pm he told his wife he needs to go to the emergency room and he had a headache, possibly shortness or breath unclear if anxiety, they check a CT head, he had a headache, they drew blood abd cxr. In the waiting room he couldn't tell time or the clock on the wall not because of vision he could see the numbers but couldn;t tell wife what time it was on a manual clock. He felt nauseated. Headache on the right, a pain, ran down to the jaw, not pulsating or pounding, no light or sound sensitivity. He had some episodes in 2017 but this was slightly different.  Patient complains of symptoms per HPI as well as the following symptoms: episode ov vision changes and confusion . Pertinent negatives and positives per HPI. All others negative  Reviewed notes, labs and imaging from outside physicians, which showed:   BUN 15 creatinine 0.81 09/04/2021  CT head 09/05/2021: COMPARISON:  MRI 05/24/2015   FINDINGS: Brain: Diffuse cerebral atrophy. Mild ventricular dilatation consistent with central atrophy. A few patchy areas of low-attenuation change in the deep white matter consistent with small vessel ischemia. No mass-effect or midline shift. No abnormal extra-axial  fluid collections. Gray-white matter junctions are distinct. Basal cisterns are not effaced. No acute intracranial hemorrhage.   Vascular: Intracranial arterial vascular calcifications are present.   Skull: Calvarium appears intact.   Sinuses/Orbits: Paranasal sinuses and mastoid air cells are clear.   Other: None.    IMPRESSION: No acute intracranial abnormalities. Chronic atrophy and small vessel ischemic changes.  Personally reviewed and agree with above NP visits: UPDATE 05/21/2018CM John Sheppard, 81 year old male returns for follow-up with history of transient vision changes. MRI of the brain 05/25/2015 with mild scattered periventricular and subcortical chronic small vessel ischemic disease. No acute findings. MRA of the head No focal stenosis or occlusions noted.  Normal variant branch pattern of intracranial circulation. Specifically, the posterior circulation is hypoplastic and the bilateral posterior  cerebral arteries arise from the anterior circulation. Also, the bilateral anterior cerebral arteries arise primarily from the left sided circulation. Carotid Doppler was negative for significant stenosis. He continues to see Dr. Rayann Sheppard,  Cardiology patient has history of ablation for atrial fibrillation. He remains on Plavix with no further stroke or TIA symptoms. He walks a mile every day and does flexibility exercises. Blood pressure in the office today 136/74. He remains on pravachol for his hyperlipidemia without complaints of myalgias. He returns for reevaluation  HISTORY 05/12/15 John Sheppard. is a 81 y.o. male here as a referral from Dr. Philip Sheppard and Dr. Rayann Sheppard for vision changes. Past medical history of atrial fibrillation, atrial flutter, status post ablation and cardioversion, macular degeneration, hypothyroidism. He has had 2 ablations and does not have afib anymore. He has had 4 episodes of transient vision changes over the last 4-5 years with vision changes in the right eye, gets blurry, he has trouble remembering names in the room, goes on for 4-10 minutes. No known triggers. Two of these have occurred in the last 4-6 weeks. Last episode was a week ago. When symptoms occur, he can follow the conversation. Last happened a week ago. He has trouble reading during the episodes because of his  vision loss. He also has macular degeneration of the right eye. He is scheduled for an ESI on Monday for LBP. No other associated symptoms with the blurred vision(no headache) except for slight confusion. When it would happen he would say his ABCs, he could say the ABCs and there was no loss of awareness just blurry periphery in the right eye only. He goes to Dr. Nolon Stalls for ophthalmology and he has not mentioned it to her yet. No other associated symptoms with episodes, not lightheaded, not dizzy, no SOB, no CP, not diaphoretic, no headaches. Episodes last 4-5 minutes. Blurriness with lines in it. No history of migraines. No associated headache. No associated numbness or tingling. He has been in a room full of people and no one notices anything different. No other focal neurologic symptoms. He is currently just on aspirin, he discontinued warfarin after his ablation.   HPI 05/12/2015 Dr John Sheppard:  John Sheppard. is a 81 y.o. male here as a referral from Dr. Philip Sheppard and Dr. Rayann Sheppard for vision changes. Past medical history of atrial fibrillation, atrial flutter, status post ablation and cardioversion, macular degeneration, hypothyroidism. He has had 2 ablations and does not have afib anymore. He has had 4 episodes of transient vision changes over the last 4-5 years with vision changes in the right eye, gets blurry, he has trouble remembering names in the room, goes on for 4-10 minutes. No known triggers. Two of these have occurred in the  last 4-6 weeks. Last episode was a week ago. When symptoms occur, he can follow the conversation. Last happened a week ago. He has trouble reading during the episodes because of his vision loss. He also has macular degeneration of the right eye. He is scheduled for an ESI on Monday for LBP. No other associated symptoms with the blurred vision(no headache) except for slight confusion. When it would happen he would say his ABCs, he could say the ABCs and there was no loss of  awareness just blurry periphery in the right eye only. He goes to Dr. Nolon Stalls for ophthalmology and he has not mentioned it to her yet. No other associated symptoms with episodes, not lightheaded, not dizzy, no SOB, no CP, not diaphoretic, no headaches. Episodes last 4-5 minutes. Blurriness with lines in it. No history of migraines. No associated headache. No associated numbness or tingling. He has been in a room full of people and no one notices anything different. No other focal neurologic symptoms. He is currently just on aspirin, he discontinued warfarin after his ablation.  Reviewed notes, labs and imaging from outside physicians, which showed: Patient follows with Dr. Rayann Sheppard. He denies palpitations, chest pain, shortness of breath, lower extremity edema, dizziness, presyncope or syncope. He does have previous history of atrial fibrillation and atrial flutter status post ablation and cardioversion. His A. fib is well controlled status post ablation. He is on sotalol. He wanted to. He is asymptomatic bradycardia. Chad2vasc score is 1. He is on aspirin.  Review of Systems: Patient complains of symptoms per HPI as well as the following symptoms: Aching muscles and left femoral nerve. Pertinent negatives per HPI. All others negative.   Social History   Socioeconomic History   Marital status: Married    Spouse name: [name]   Number of children: 2   Years of education: 16   Highest education level: Not on file  Occupational History   Occupation: Retired  Tobacco Use   Smoking status: Never   Smokeless tobacco: Never   Tobacco comments:    Never smoke 10/23/21  Vaping Use   Vaping Use: Never used  Substance and Sexual Activity   Alcohol use: Yes    Alcohol/week: 2.0 standard drinks of alcohol    Types: 2 Cans of beer per week    Comment: beer occ. 10/23/21   Drug use: Never   Sexual activity: Yes  Other Topics Concern   Not on file  Social History Narrative   Lives w/ wife    Caffeine use:  Drinks tea/diet pepsi daily   Social Determinants of Health   Financial Resource Strain: Not on file  Food Insecurity: Not on file  Transportation Needs: Not on file  Physical Activity: Not on file  Stress: Not on file  Social Connections: Not on file  Intimate Partner Violence: Not on file    Family History  Problem Relation Age of Onset   Alzheimer's disease Mother    Heart failure Father        DECEASED   Stroke Sister    Alzheimer's disease Maternal Grandfather    Colon cancer Neg Hx    Stomach cancer Neg Hx     Past Medical History:  Diagnosis Date   Atrial flutter ([place])    s/p CTI ablation 10/20/09   Chronic lower back pain    Coronary artery disease    Dry age-related macular degeneration of right eye    Hay fever    High cholesterol  Hypertension    Hyperthyroidism    "never treated for this" (08/20/2017)   Paroxysmal atrial fibrillation ([place])    s/p PVI 10/20/09, 10/19/10   Pneumonia 1992    Past Surgical History:  Procedure Laterality Date   ATRIAL ABLATION SURGERY  10/20/2009; 10/19/2010   PVI and CTI ablation by [facility]    CARDIOVERSION  2001   CATARACT EXTRACTION W/ INTRAOCULAR LENS  IMPLANT, BILATERAL Bilateral 08/2010   COLONOSCOPY     "~ 6 month after 1st colonoscopy; no polyps 2nd time"   COLONOSCOPY W/ BIOPSIES AND POLYPECTOMY     CORONARY STENT INTERVENTION N/A 08/20/2017   Procedure: CORONARY STENT INTERVENTION;  Surgeon: [name], MD;  Location: [facility];  Service: Cardiovascular;  Laterality: N/A;   RIGHT/LEFT HEART CATH AND CORONARY ANGIOGRAPHY N/A 08/20/2017   Procedure: RIGHT/LEFT HEART CATH AND CORONARY ANGIOGRAPHY;  Surgeon: [name], MD;  Location: [facility];  Service: Cardiovascular;  Laterality: N/A;   TRANSESOPHAGEAL ECHOCARDIOGRAM  10/2009   EF: 60-65%    Current Outpatient Medications  Medication Sig Dispense Refill   apixaban (ELIQUIS) 5 MG TABS tablet Take 1 tablet (5 mg  total) by mouth 2 (two) times daily. 180 tablet 2   aspirin 81 MG chewable tablet Chew 1 tablet (81 mg total) by mouth daily. 90 tablet 3   Cyanocobalamin (B-12) 2500 MCG TABS Take 1 tablet by mouth daily.     ferrous sulfate 325 (65 FE) MG tablet Take 325 mg by mouth daily with breakfast.     gabapentin (NEURONTIN) 300 MG capsule Take 1 capsule by mouth daily.     lisinopril (ZESTRIL) 10 MG tablet TAKE 1 TABLET AS NEEDED FOR ELEVATED BLOOD PRESSURE 90 tablet 1   meclizine (ANTIVERT) 25 MG tablet Take 1 tablet by mouth as needed for dizziness.     Multiple Vitamin (MULTIVITAMIN) tablet Take 1 tablet by mouth daily.       Multiple Vitamins-Minerals (PRESERVISION/LUTEIN PO) Take 1 capsule by mouth 2 (two) times daily.      Omega-3 Fatty Acids (FISH OIL) 1000 MG CPDR Take 1 capsule by mouth daily.      rosuvastatin (CRESTOR) 20 MG tablet Take 1 tablet by mouth daily.     sotalol (BETAPACE) 160 MG tablet Take 0.5 tablets (80 mg total) by mouth 2 (two) times daily. 90 tablet 2   nitroGLYCERIN (NITROSTAT) 0.4 MG SL tablet Place 1 tablet (0.4 mg total) under the tongue every 5 (five) minutes as needed for chest pain. 25 tablet 3   No current facility-administered medications for this visit.    Allergies as of 11/08/2021   (No Known Allergies)    Vitals: BP (!) 144/75   Pulse (!) 50   Ht 6' (1.829 m)   Wt 182 lb 3.2 oz (82.6 kg)   BMI 24.71 kg/m  Last Weight:  Wt Readings from Last 1 Encounters:  11/08/21 182 lb 3.2 oz (82.6 kg)   Last Height:   Ht Readings from Last 1 Encounters:  11/08/21 6' (1.829 m)  Physical exam: Exam: Gen: NAD, conversant, well nourised, well groomed                     CV: RRR, no MRG. No Carotid Bruits. No peripheral edema, warm, nontender Eyes: Conjunctivae clear without exudates or hemorrhage  Neuro: Detailed Neurologic Exam  Speech:    Speech is normal; fluent and spontaneous with normal comprehension.  Cognition:    The  patient is oriented to  person, place, and time;     recent and remote memory intact;     language fluent;     normal attention, concentration,     fund of knowledge Cranial Nerves:    The pupils are equal, round, and reactive to light.  Pupils too small to visualize fundi attempted.  Extraocular movements are intact. Trigeminal sensation is intact and the muscles of mastication are normal. The face is symmetric. The palate elevates in the midline. Hearing intact. Voice is normal. Shoulder shrug is normal. The tongue has normal motion without fasciculations.   Coordination:    Normal   Gait:    Heel-toe and tandem gait are normal.   Motor Observation:    No asymmetry, no atrophy, and no involuntary movements noted. Tone:    Normal muscle tone.    Posture:    Posture is normal. normal erect    Strength:    Strength is V/V in the upper and lower limbs.      Sensation: intact to LT     Reflex Exam:  DTR's:    Deep tendon reflexes in the upper and lower extremities are symmetrical bilaterally.   Toes:    The toes are downgoing bilaterally.   Clonus:    Clonus is absent.      Assessment/Plan:  59 81 year old here for follow up after ED visit. I reviewed emergency room notes, on 09/05/2021 he presented with complaints of weakness headache and having difficulty and per putting time on his watch.  Also forgetfulness in the waiting room.  Work-up was unremarkable including EKG, laboratory studies and CT scan of the head, by the time he was examined he appeared to have no neurologic deficits.He has had several ablations, very rarely now with afib on asa and not on [facility] after several ablations and sotalol. He had an episode of AMS for several hours, he had afib on Sunday (woke up with it) and played golf and 2 days later he was reading and his vision became fuzzy around the edges on the left like rainfall in the corner of the eye if he moved his eye it went away, he was reading a book and he was having  difficulty reading, he could read the word but couldn't understand the sentence, he wasn't feeling well that day all day and at 730pm he told his wife he needs to go to the emergency room and he had a headache, possibly shortness or breath unclear if anxiety. In the waiting room he couldn't tell time or the clock on the wall not because of vision he could see the numbers but couldn;t tell wife what time it was on a manual clock. He felt nauseated. Headache on the right, a pain, ran down to the jaw, not pulsating or pounding, no light or sound sensitivity. He had some episodes of transient vision changes in 2017 but this was different.   MRI showed an embolic stroke, patient was placed on eliquis due to afib likely causing it. We reviewed today. We discussed eliquis. We also discusssed formal memory testing given his maternal history and his symptoms. He declines a baseline but if he changes his mind he will let us know. We discussed alzheimers disease.   I had a long d/w patient about his recent stroke, risk for stroke/recurrent TIAs. Continue eliquis for embolic stroke prevention and maintain strict control of hypertension with blood pressure goal below 130/90, diabetes with hemoglobin A1c goal below 6.5% and  lipids with LDL cholesterol goal below 70 mg/dL. I also advised the patient to eat a healthy diet with plenty of whole grains, lean protein, fruits and vegetables, exercise regularly and maintain ideal body weight .Followup in the future with me as needed.   Sarina Ill, MD  [facility] [address] [place],  [place] [address]  Phone [phone] Fax [phone]  I spent over 40 minutes of face-to-face and non-face-to-face time with patient on the  1. Cerebrovascular accident (CVA) due to embolism of right middle cerebral artery (HCC)   2. Paroxysmal atrial fibrillation ([place])   3. FHx: Alzheimer's disease    diagnosis.  This included previsit chart review,  lab review, study review, order entry, electronic health record documentation, patient education on the different diagnostic and therapeutic options, counseling and coordination of care, risks and benefits of management, compliance, or risk factor reduction

## 2021-11-08 NOTE — Patient Instructions (Addendum)
Alzheimer's Disease Alzheimer's disease is a brain disease that affects memory, thinking, language, and behavior. People with Alzheimer's disease lose mental abilities, and the disease gets worse over time. Alzheimer's disease is a form of dementia. What are the causes? This condition develops when a protein called beta-amyloid forms deposits in the brain. It is not known what causes these deposits to form. Alzheimer's disease may also be caused by a gene mutation that is inherited from one parent or both parents. A gene mutation is a harmful change in a gene. Not everyone who inherits the genetic mutation will get the disease. What increases the risk? You are more likely to develop this condition if you: Are older than age 88. Are male. Have any of these medical conditions: High blood pressure. Diabetes. Heart or blood vessel disease. Smoke. Have obesity. Have had a brain injury. Have had a stroke. Have a family history of dementia. What are the signs or symptoms? Symptoms of this condition may happen in three stages, which often overlap. Early stage In this stage, you may continue to be independent. You may still be able to drive, work, and be social. Symptoms in this stage include: Minor memory problems, such as forgetting a name, words, or what you did recently. Difficulty with: Paying attention. Communicating. Doing familiar tasks. Problem solving or doing calculations. Following instructions. Learning new things. Anxiety. Social withdrawal. Loss of motivation. Moderate stage In this stage, you will start to need care. Symptoms in this stage include: Difficulty with expressing thoughts. Memory loss that affects daily life. This can include forgetting: Recent events that have happened. If you have taken medicines or eaten. Familiar places. You may get lost while walking or driving. To pay bills or manage finances. Personal hygiene such as bathing or using the  bathroom. Confusion about where you are or what time it is. Difficulty in judging distance. Changes in personality, mood, and behavior. You may be moody, irritable, angry, frustrated, fearful, anxious, or suspicious. Poor reasoning and judgment. Delusions or hallucinations. Changes in sleep patterns. Severe stage In the final stage, you will need help with your personal care and daily activities. Symptoms in this stage include: Worsening memory loss. Personality changes. Loss of awareness of your surroundings. Changes in physical abilities, including the ability to walk, sit, and swallow. Difficulty in communicating. Inability to control your bladder and bowels. Increasing confusion. Increasing behavior changes. How is this diagnosed?  This condition is diagnosed by a health care provider who specializes in diseases of the nervous system (neurologist) or one who specializes in care of the elderly (geriatrician or geriatric psychiatrist). Other causes of dementia may also be ruled out. Your health care provider will talk with you and your family, friends, or caregivers about your history and symptoms. A thorough medical history will be taken, and you will have a physical exam and tests. Tests may include: Lab tests, such as blood or urine tests. Imaging tests, such as a CT scan, a PET scan, or an MRI. A lumbar puncture. This test involves removing and testing a small amount of the fluid that surrounds the brain and spinal cord. An electroencephalogram (EEG). In this test, small metal discs are used to measure electrical activity in the brain. Memory tests, cognitive tests, and neuropsychological tests. These tests evaluate brain function. Genetic testing. This may be done if you have early onset of the disease (before age 49) or if other family members have the disease. How is this treated? At this time, there is  no treatment to cure Alzheimer's disease or stop it from getting worse. The  goals of treatment are: To manage behavioral changes. To provide you with a safe environment. To help manage daily life for you and your caregivers. The following treatment options are available: Medicines. Medicines may help the memory work better and manage behavioral symptoms. Cognitive therapy. Cognitive therapy provides you with education, support, and memory aids. It is most helpful in the early stages of the condition. Counseling or spiritual guidance. It is normal to have a lot of feelings, including anger, relief, fear, and isolation. Counseling and guidance can help you deal with these feelings. Caregiving. This involves having caregivers help you with your daily activities. Family support groups. These provide education, emotional support, and information about community resources to family members who are taking care of you. Follow these instructions at home:  Medicines Take over-the-counter and prescription medicines only as told by your health care provider. Use a pill organizer or pill reminder to help you manage your medicines. Avoid taking medicines that can affect thinking, such as pain medicines or sleeping medicines. Lifestyle Make healthy lifestyle choices: Be physically active as told by your health care provider. Regular exercise may help improve symptoms. Do not use any products that contain nicotine or tobacco, such as cigarettes, e-cigarettes, and chewing tobacco. If you need help quitting, ask your health care provider. Do not drink alcohol. Eat a healthy diet. Practice stress-management techniques when you get stressed. Stay social. Drink enough fluid to keep your urine pale yellow. Make sure to get quality sleep. Avoid taking long naps during the day. Take short naps of 30 minutes or less if needed. Keep your sleeping area dark and cool. Avoid exercising during the few hours before you go to bed. Avoid caffeine products in the afternoon and evening. General  instructions Work with your health care provider to determine what you need help with and what your safety needs are. If you were given a bracelet that identifies you as a person with memory loss or tracks your location, make sure to wear it at all times. Talk with your health care provider about whether it is safe for you to drive. Work with your family to make important decisions, such as advance directives, medical power of attorney, or a living will. Keep all follow-up visits. This is important. Where to find more information The Alzheimer's Association: Call the 24-hour helpline at 1-(662)205-2292, or visit [employer] Contact a health care provider if: You have nausea, vomiting, or trouble with eating related to a medicine. You have worsening mood or behavior changes, such as depression, anxiety, or hallucinations. You or your family members become concerned for your safety. Get help right away if: You become less responsive or are difficult to wake up. Your memory suddenly gets worse. You feel that you want to harm yourself. If you ever feel like you may hurt yourself or others, or have thoughts about taking your own life, get help right away. Go to your nearest emergency department or: Call your local emergency services (911 in the [place] Call a suicide crisis helpline, such as the [place] at 718-300-5952 or 988 in the [place]. This is open 24 hours a day in the [place] Text the Crisis Text Line at 628-620-0486 (in the [place] Summary Alzheimer's disease is a brain disease that affects memory, thinking, language, and behavior. Alzheimer's disease is a form of dementia. This condition is diagnosed by a specialist in diseases of the nervous  system (neurologist) or one who specializes in care of the elderly. At this time, there is no treatment to cure Alzheimer's disease or stop it from getting worse. The goal of treatment is to help you manage any symptoms. Work with  your family to make important decisions, such as advance directives, medical power of attorney, or a living will. This information is not intended to replace advice given to you by your health care provider. Make sure you discuss any questions you have with your health care provider. Document Revised: 08/23/2020 Document Reviewed: 05/17/2019 Elsevier Patient Education  [place].  Stroke Prevention Some medical conditions and behaviors can lead to a higher chance of having a stroke. You can help prevent a stroke by eating healthy, exercising, not smoking, and managing any medical conditions you have. Stroke is a leading cause of functional impairment. Primary prevention is particularly important because a majority of strokes are first-time events. Stroke changes the lives of not only those who experience a stroke but also their family and other caregivers. How can this condition affect me? A stroke is a medical emergency and should be treated right away. A stroke can lead to brain damage and can sometimes be life-threatening. If a person gets medical treatment right away, there is a better chance of surviving and recovering from a stroke. What can increase my risk? The following medical conditions may increase your risk of a stroke: Cardiovascular disease. High blood pressure (hypertension). Diabetes. High cholesterol. Sickle cell disease. Blood clotting disorders (hypercoagulable state). Obesity. Sleep disorders (obstructive sleep apnea). Other risk factors include: Being older than age 82. Having a history of blood clots, stroke, or mini-stroke (transient ischemic attack, TIA). Genetic factors, such as race, ethnicity, or a family history of stroke. Smoking cigarettes or using other tobacco products. Taking birth control pills, especially if you also use tobacco. Heavy use of alcohol or drugs, especially cocaine and methamphetamine. Physical inactivity. What actions can I take  to prevent this? Manage your health conditions High cholesterol levels. Eating a healthy diet is important for preventing high cholesterol. If cholesterol cannot be managed through diet alone, you may need to take medicines. Take any prescribed medicines to control your cholesterol as told by your health care provider. Hypertension. To reduce your risk of stroke, try to keep your blood pressure below 130/80. Eating a healthy diet and exercising regularly are important for controlling blood pressure. If these steps are not enough to manage your blood pressure, you may need to take medicines. Take any prescribed medicines to control hypertension as told by your health care provider. Ask your health care provider if you should monitor your blood pressure at home. Have your blood pressure checked every year, even if your blood pressure is normal. Blood pressure increases with age and some medical conditions. Diabetes. Eating a healthy diet and exercising regularly are important parts of managing your blood sugar (glucose). If your blood sugar cannot be managed through diet and exercise, you may need to take medicines. Take any prescribed medicines to control your diabetes as told by your health care provider. Get evaluated for obstructive sleep apnea. Talk to your health care provider about getting a sleep evaluation if you snore a lot or have excessive sleepiness. Make sure that any other medical conditions you have, such as atrial fibrillation or atherosclerosis, are managed. Nutrition Follow instructions from your health care provider about what to eat or drink to help manage your health condition. These instructions may include: Reducing your  daily calorie intake. Limiting how much salt (sodium) you use to 1,500 milligrams (mg) each day. Using only healthy fats for cooking, such as olive oil, canola oil, or sunflower oil. Eating healthy foods. You can do this by: Choosing foods that are high in  fiber, such as whole grains, and fresh fruits and vegetables. Eating at least 5 servings of fruits and vegetables a day. Try to fill one-half of your plate with fruits and vegetables at each meal. Choosing lean protein foods, such as lean cuts of meat, poultry without skin, fish, tofu, beans, and nuts. Eating low-fat dairy products. Avoiding foods that are high in sodium. This can help lower blood pressure. Avoiding foods that have saturated fat, trans fat, and cholesterol. This can help prevent high cholesterol. Avoiding processed and prepared foods. Counting your daily carbohydrate intake.  Lifestyle If you drink alcohol: Limit how much you have to: 0-1 drink a day for women who are not pregnant. 0-2 drinks a day for men. Know how much alcohol is in your drink. In the [place] one drink equals one 12 oz bottle of beer (349m), one 5 oz glass of wine (144m, or one 1 oz glass of hard liquor (4488m Do not use any products that contain nicotine or tobacco. These products include cigarettes, chewing tobacco, and vaping devices, such as e-cigarettes. If you need help quitting, ask your health care provider. Avoid secondhand smoke. Do not use drugs. Activity  Try to stay at a healthy weight. Get at least 30 minutes of exercise on most days, such as: Fast walking. Biking. Swimming. Medicines Take over-the-counter and prescription medicines only as told by your health care provider. Aspirin or blood thinners (antiplatelets or anticoagulants) may be recommended to reduce your risk of forming blood clots that can lead to stroke. Avoid taking birth control pills. Talk to your health care provider about the risks of taking birth control pills if: You are over 35 49ars old. You smoke. You get very bad headaches. You have had a blood clot. Where to find more information American Stroke Association: www.strokeassociation.org Get help right away if: You or a loved one has any symptoms of a  stroke. "BE FAST" is an easy way to remember the main warning signs of a stroke: B - Balance. Signs are dizziness, sudden trouble walking, or loss of balance. E - Eyes. Signs are trouble seeing or a sudden change in vision. F - Face. Signs are sudden weakness or numbness of the face, or the face or eyelid drooping on one side. A - Arms. Signs are weakness or numbness in an arm. This happens suddenly and usually on one side of the body. S - Speech. Signs are sudden trouble speaking, slurred speech, or trouble understanding what people say. T - Time. Time to call emergency services. Write down what time symptoms started. You or a loved one has other signs of a stroke, such as: A sudden, severe headache with no known cause. Nausea or vomiting. Seizure. These symptoms may represent a serious problem that is an emergency. Do not wait to see if the symptoms will go away. Get medical help right away. Call your local emergency services (911 in the [place] Do not drive yourself to the hospital. Summary You can help to prevent a stroke by eating healthy, exercising, not smoking, limiting alcohol intake, and managing any medical conditions you may have. Do not use any products that contain nicotine or tobacco. These include cigarettes, chewing tobacco, and vaping devices, such as  e-cigarettes. If you need help quitting, ask your health care provider. Remember "BE FAST" for warning signs of a stroke. Get help right away if you or a loved one has any of these signs. This information is not intended to replace advice given to you by your health care provider. Make sure you discuss any questions you have with your health care provider. Document Revised: 08/30/2019 Document Reviewed: 08/30/2019 Elsevier Patient Education  [place].

## 2021-11-12 ENCOUNTER — Encounter: Payer: Self-pay | Admitting: Cardiovascular Disease

## 2021-11-12 ENCOUNTER — Telehealth: Payer: Self-pay | Admitting: Cardiovascular Disease

## 2021-11-12 ENCOUNTER — Ambulatory Visit: Payer: Medicare HMO | Attending: Cardiovascular Disease | Admitting: Cardiovascular Disease

## 2021-11-12 VITALS — BP 138/68 | HR 54 | Ht 72.0 in | Wt 179.0 lb

## 2021-11-12 DIAGNOSIS — I480 Paroxysmal atrial fibrillation: Secondary | ICD-10-CM | POA: Diagnosis not present

## 2021-11-12 NOTE — Telephone Encounter (Signed)
Patient's wife states they received a call this morning. I did not see any notes and they did not know the reason for the call.

## 2021-11-12 NOTE — Telephone Encounter (Signed)
Called patient's wife back, I had left a message in regards to the patients appointment this morning. They are on the way to the office and will be seeing Dr. Myles Gip at 10:15am

## 2021-11-12 NOTE — Patient Instructions (Signed)
Medication Instructions:  none *If you need a refill on your cardiac medications before your next appointment, please call your pharmacy*   Lab Work: none If you have labs (blood work) drawn today and your tests are completely normal, you will receive your results only by: [place] (if you have MyChart) OR A paper copy in the mail If you have any lab test that is abnormal or we need to change your treatment, we will call you to review the results.   Testing/Procedures: none   Follow-Up: At [facility], you and your health needs are our priority.  As part of our continuing mission to provide you with exceptional heart care, we have created designated Provider Care Teams.  These Care Teams include your primary Cardiologist (physician) and Advanced Practice Providers (APPs -  Physician Assistants and Nurse Practitioners) who all work together to provide you with the care you need, when you need it.  We recommend signing up for the patient portal called "MyChart".  Sign up information is provided on this After Visit Summary.  MyChart is used to connect with patients for Virtual Visits (Telemedicine).  Patients are able to view lab/test results, encounter notes, upcoming appointments, etc.  Non-urgent messages can be sent to your provider as well.   To learn more about what you can do with MyChart, go to [email or web address].    Your next appointment:   6 month(s)  The format for your next appointment:   In Person  Provider:   Doralee Albino, MD    Other Instructions None   Important Information About Sugar

## 2021-11-12 NOTE — Progress Notes (Signed)
Cardiology Office Note:    Date:  11/12/2021   ID:  John Sheppard., DOB 07-16-1940, MRN [id number]  PCP:  [name], MD   [place] Prov[facility] Cardiologist:  [name], MD Electrophysiologist:  [name], MD     Referring MD: [name], MD   No chief complaint on file. Here for routine follow-up. Feel great  History of Present Illness:    John Sheppard. is a 81 y.o. male with a hx of HTN, hyperthyroid, CAD, CVA, atrial flutter, atrial fibrillation who presents for management of AF.  Maintained on sotalol and doing very well.  He has no complaints or side effects from the medication.  He has rare episodes of A-fib, maybe lasting a few minutes, occurring maybe 3 or 4 times a year.  At this point he feels well and does not wish to pursue any more aggressive interventions.  He has not had any shortness of breath, chest pain, syncope, presyncope, ankle edema.  Past Medical History:  Diagnosis Date   Atrial flutter ([place])    s/p CTI ablation 10/20/09   Chronic lower back pain    Coronary artery disease    Dry age-related macular degeneration of right eye    Hay fever    High cholesterol    Hypertension    Hyperthyroidism    "never treated for this" (08/20/2017)   Paroxysmal atrial fibrillation ([place])    s/p PVI 10/20/09, 10/19/10   Pneumonia 1992    Past Surgical History:  Procedure Laterality Date   ATRIAL ABLATION SURGERY  10/20/2009; 10/19/2010   PVI and CTI ablation by [facility]    CARDIOVERSION  2001   CATARACT EXTRACTION W/ INTRAOCULAR LENS  IMPLANT, BILATERAL Bilateral 08/2010   COLONOSCOPY     "~ 6 month after 1st colonoscopy; no polyps 2nd time"   COLONOSCOPY W/ BIOPSIES AND POLYPECTOMY     CORONARY STENT INTERVENTION N/A 08/20/2017   Procedure: CORONARY STENT INTERVENTION;  Surgeon: [name], MD;  Location: [facility];  Service: Cardiovascular;  Laterality: N/A;   RIGHT/LEFT HEART CATH AND CORONARY  ANGIOGRAPHY N/A 08/20/2017   Procedure: RIGHT/LEFT HEART CATH AND CORONARY ANGIOGRAPHY;  Surgeon: [name], MD;  Location: [facility];  Service: Cardiovascular;  Laterality: N/A;   TRANSESOPHAGEAL ECHOCARDIOGRAM  10/2009   EF: 60-65%    Current Medications: Current Meds  Medication Sig   apixaban (ELIQUIS) 5 MG TABS tablet Take 1 tablet (5 mg total) by mouth 2 (two) times daily.   aspirin 81 MG chewable tablet Chew 1 tablet (81 mg total) by mouth daily.   Cyanocobalamin (B-12) 2500 MCG TABS Take 1 tablet by mouth daily.   ferrous sulfate 325 (65 FE) MG tablet Take 325 mg by mouth daily with breakfast.   gabapentin (NEURONTIN) 300 MG capsule Take 1 capsule by mouth daily.   lisinopril (ZESTRIL) 10 MG tablet TAKE 1 TABLET AS NEEDED FOR ELEVATED BLOOD PRESSURE   meclizine (ANTIVERT) 25 MG tablet Take 1 tablet by mouth as needed for dizziness.   Multiple Vitamin (MULTIVITAMIN) tablet Take 1 tablet by mouth daily.     Multiple Vitamins-Minerals (PRESERVISION/LUTEIN PO) Take 1 capsule by mouth 2 (two) times daily.    Omega-3 Fatty Acids (FISH OIL) 1000 MG CPDR Take 1 capsule by mouth daily.    rosuvastatin (CRESTOR) 20 MG tablet Take 1 tablet by mouth daily.   sotalol (BETAPACE) 160 MG tablet Take 0.5 tablets (80 mg total) by  mouth 2 (two) times daily.     Allergies:   Patient has no known allergies.   Social History   Socioeconomic History   Marital status: Married    Spouse name: [name]   Number of children: 2   Years of education: 16   Highest education level: Not on file  Occupational History   Occupation: Retired  Tobacco Use   Smoking status: Never   Smokeless tobacco: Never   Tobacco comments:    Never smoke 10/23/21  Vaping Use   Vaping Use: Never used  Substance and Sexual Activity   Alcohol use: Yes    Alcohol/week: 2.0 standard drinks of alcohol    Types: 2 Cans of beer per week    Comment: beer occ. 10/23/21   Drug use: Never   Sexual  activity: Yes  Other Topics Concern   Not on file  Social History Narrative   Lives w/ wife   Caffeine use:  Drinks tea/diet pepsi daily   Social Determinants of Health   Financial Resource Strain: Not on file  Food Insecurity: Not on file  Transportation Needs: Not on file  Physical Activity: Not on file  Stress: Not on file  Social Connections: Not on file     Family History: The patient's family history includes Alzheimer's disease in his maternal grandfather and mother; Heart failure in his father; Stroke in his sister. There is no history of Colon cancer or Stomach cancer.  ROS:   Please see the history of present illness.     All other systems reviewed and are negative.  EKGs/Labs/Other Studies Reviewed:    The following studies were reviewed today: TTE 10/26/2021 : Normal  EKG:  EKG is  ordered today.  The ekg ordered today demonstrates sinus bradycardia, rate 48 and normal QT interval  Recent Labs: 09/04/2021: BUN 15; Creatinine, Ser 0.81; Hemoglobin 14.3; Platelets 207; Potassium 4.0; Sodium 136  Recent Lipid Panel    Component Value Date/Time   CHOL 139 08/21/2017 0215   TRIG 98 08/21/2017 0215   HDL 36 (L) 08/21/2017 0215   CHOLHDL 3.9 08/21/2017 0215   VLDL 20 08/21/2017 0215   LDLCALC 83 08/21/2017 0215     Risk Assessment/Calculations:    CHA2DS2-VASc Score = 6   This indicates a 9.7% annual risk of stroke. The patient's score is based upon: CHF History: 0 HTN History: 1 Diabetes History: 0 Stroke History: 2 Vascular Disease History: 1 Age Score: 2 Gender Score: 0            Physical Exam:    VS:  BP 138/68   Pulse (!) 54   Ht 6' (1.829 m)   Wt 179 lb (81.2 kg)   SpO2 97%   BMI 24.28 kg/m     Wt Readings from Last 3 Encounters:  11/12/21 179 lb (81.2 kg)  11/08/21 182 lb 3.2 oz (82.6 kg)  10/23/21 183 lb 12.8 oz (83.4 kg)     GEN: Well nourished, well developed in no acute distress HEENT: Normal NECK: No JVD; No carotid  bruits LYMPHATICS: No lymphadenopathy CARDIAC: RRR, no murmurs, rubs, gallops RESPIRATORY:  Clear to auscultation without rales, wheezing or rhonchi  ABDOMEN: Soft, non-tender, non-distended MUSCULOSKELETAL:  No edema; No deformity  SKIN: Warm and dry NEUROLOGIC:  Alert and oriented x 3 PSYCHIATRIC:  Normal affect   ASSESSMENT:    No diagnosis found. PLAN:    In order of problems listed above:  Atrial fibrillation: paroxysmal. Having rare breakthrough episodes.  Doing well on Tikosyn High risk medication: on Tikosyn with appropriate Qtc on ECG today. Renal function good 2 months ago Long term use of anticoagulation: continue eliquis '5mg'$  BID        Medication Adjustments/Labs and Tests Ordered: Current medicines are reviewed at length with the patient today.  Concerns regarding medicines are outlined above.  No orders of the defined types were placed in this encounter.  No orders of the defined types were placed in this encounter.   There are no Patient Instructions on file for this visit.   Signed, [name], MD  11/12/2021 10:13 AM    [place]

## 2021-11-13 ENCOUNTER — Encounter: Payer: Self-pay | Admitting: Cardiovascular Disease

## 2021-12-10 ENCOUNTER — Other Ambulatory Visit: Payer: Self-pay | Admitting: Student

## 2021-12-14 ENCOUNTER — Other Ambulatory Visit (HOSPITAL_COMMUNITY): Payer: Self-pay

## 2021-12-14 MED ORDER — APIXABAN 5 MG PO TABS: 5.0000 mg | ORAL_TABLET | Freq: Two times a day (BID) | ORAL | 0 refills | Status: DC

## 2021-12-26 ENCOUNTER — Other Ambulatory Visit: Payer: Self-pay

## 2021-12-26 ENCOUNTER — Emergency Department (HOSPITAL_BASED_OUTPATIENT_CLINIC_OR_DEPARTMENT_OTHER)
Admission: EM | Admit: 2021-12-26 | Discharge: 2021-12-26 | Disposition: A | Discharge status: Home / Self Care | Payer: Medicare HMO | Attending: Emergency Medicine | Admitting: Emergency Medicine

## 2021-12-26 ENCOUNTER — Encounter (HOSPITAL_BASED_OUTPATIENT_CLINIC_OR_DEPARTMENT_OTHER): Payer: Self-pay

## 2021-12-26 ENCOUNTER — Telehealth: Payer: Self-pay | Admitting: Cardiovascular Disease

## 2021-12-26 DIAGNOSIS — Z7982 Long term (current) use of aspirin: Secondary | ICD-10-CM | POA: Diagnosis not present

## 2021-12-26 DIAGNOSIS — I10 Essential (primary) hypertension: Secondary | ICD-10-CM | POA: Diagnosis not present

## 2021-12-26 DIAGNOSIS — Z79899 Other long term (current) drug therapy: Secondary | ICD-10-CM | POA: Diagnosis not present

## 2021-12-26 DIAGNOSIS — R519 Headache, unspecified: Secondary | ICD-10-CM | POA: Diagnosis present

## 2021-12-26 DIAGNOSIS — E039 Hypothyroidism, unspecified: Secondary | ICD-10-CM | POA: Diagnosis not present

## 2021-12-26 DIAGNOSIS — I2510 Atherosclerotic heart disease of native coronary artery without angina pectoris: Secondary | ICD-10-CM | POA: Insufficient documentation

## 2021-12-26 DIAGNOSIS — Z7901 Long term (current) use of anticoagulants: Secondary | ICD-10-CM | POA: Diagnosis not present

## 2021-12-26 LAB — CBC WITH DIFFERENTIAL/PLATELET
Abs Immature Granulocytes: 0.02 10*3/uL (ref 0.00–0.07)
Basophils Absolute: 0 10*3/uL (ref 0.0–0.1)
Basophils Relative: 0 %
Eosinophils Absolute: 0.2 10*3/uL (ref 0.0–0.5)
Eosinophils Relative: 2 %
HCT: 41.9 % (ref 39.0–52.0)
Hemoglobin: 14.2 g/dL (ref 13.0–17.0)
Immature Granulocytes: 0 %
Lymphocytes Relative: 14 %
Lymphs Abs: 1.1 10*3/uL (ref 0.7–4.0)
MCH: 33.3 pg (ref 26.0–34.0)
MCHC: 33.9 g/dL (ref 30.0–36.0)
MCV: 98.1 fL (ref 80.0–100.0)
Monocytes Absolute: 0.7 10*3/uL (ref 0.1–1.0)
Monocytes Relative: 9 %
Neutro Abs: 6 10*3/uL (ref 1.7–7.7)
Neutrophils Relative %: 75 %
Platelets: 207 10*3/uL (ref 150–400)
RBC: 4.27 MIL/uL (ref 4.22–5.81)
RDW: 12.8 % (ref 11.5–15.5)
WBC: 8 10*3/uL (ref 4.0–10.5)
nRBC: 0 % (ref 0.0–0.2)

## 2021-12-26 LAB — BASIC METABOLIC PANEL
Anion gap: 9 (ref 5–15)
BUN: 16 mg/dL (ref 8–23)
CO2: 29 mmol/L (ref 22–32)
Calcium: 9.9 mg/dL (ref 8.9–10.3)
Chloride: 99 mmol/L (ref 98–111)
Creatinine, Ser: 0.84 mg/dL (ref 0.61–1.24)
GFR, Estimated: >60 mL/min (ref >60)
Glucose, Bld: 116 mg/dL — ABNORMAL HIGH (ref 70–99)
Potassium: 4.3 mmol/L (ref 3.5–5.1)
Sodium: 137 mmol/L (ref 135–145)

## 2021-12-26 MED ORDER — AMLODIPINE BESYLATE 5 MG PO TABS: 5.0000 mg | ORAL_TABLET | Freq: Every day | ORAL | 0 refills | Status: AC

## 2021-12-26 MED ORDER — AMLODIPINE BESYLATE 5 MG PO TABS
5.0000 mg | ORAL_TABLET | Freq: Once | ORAL | Status: AC
  Administered 2021-12-26: 5 mg via ORAL
  Filled 2021-12-26: qty 1

## 2021-12-26 NOTE — Telephone Encounter (Signed)
Pt c/o BP issue: STAT if pt c/o blurred vision, one-sided weakness or slurred speech  1. What are your last 5 BP readings?  155/78- monday 159/84 - tuesday 220/105 - today 1hr after medication (lisinopril)    2. Are you having any other symptoms (ex. Dizziness, headache, blurred vision, passed out)? No   3. What is your BP issue? Pt spouse states pt bp has been high, afib clinic told them to contact Dr. Myles Gip.

## 2021-12-26 NOTE — ED Provider Notes (Signed)
[facility] EMERGENCY DEPT Provider Note   CSN: [id number] Arrival date & time: 12/26/21  1358     History  Chief Complaint  Patient presents with   Hypertension   Headache    John Sheppard. is a 81 y.o. male with A-fib/flutter on Eliquis, history of CVA to right MCA, hypothyroidism, CAD, HTN, presents with hypertension.  Patient presents with elevated blood pressure readings without any associated symptoms.  He has a history of his CVA and takes hypertension medication.  He checks his blood pressure regularly only and has had elevated readings without any significant symptoms since 12/22/2021.  He does take an extra lisinopril occasionally but he was concerned to continue doing this.  He called his PCP to get an appointment but has not heard back yet so came to [facility] emergency department.  Patient at does take Eliquis.  He denies any headache, visual changes, chest pain, shortness of breath, palpitations, lower extremity edema.   Hypertension Associated symptoms include headaches.  Headache      Home Medications Prior to Admission medications   Medication Sig Start Date End Date Taking? Authorizing Provider  apixaban (ELIQUIS) 5 MG TABS tablet Take 1 tablet (5 mg total) by mouth 2 (two) times daily. 12/14/21   Fenton, Clint R, PA  aspirin 81 MG chewable tablet Chew 1 tablet (81 mg total) by mouth daily. 08/21/17   Kroeger, Lorelee Cover., PA-C  Cyanocobalamin (B-12) 2500 MCG TABS Take 1 tablet by mouth daily.    [provider]  ferrous sulfate 325 (65 FE) MG tablet Take 325 mg by mouth daily with breakfast.    [provider]  gabapentin (NEURONTIN) 300 MG capsule Take 1 capsule by mouth daily. 05/03/21   [provider]  lisinopril (ZESTRIL) 10 MG tablet TAKE 1 TABLET AS NEEDED FOR ELEVATED BLOOD PRESSURE 06/12/21   Allred, Jeneen Rinks, MD  meclizine (ANTIVERT) 25 MG tablet Take 1 tablet by mouth as needed for dizziness. 09/17/18   [provider]  Multiple Vitamin (MULTIVITAMIN) tablet Take 1 tablet by mouth daily.      [provider]  Multiple Vitamins-Minerals (PRESERVISION/LUTEIN PO) Take 1 capsule by mouth 2 (two) times daily.     [provider]  nitroGLYCERIN (NITROSTAT) 0.4 MG SL tablet Place 1 tablet (0.4 mg total) under the tongue every 5 (five) minutes as needed for chest pain. 08/21/17 10/23/21  Kroeger, Lorelee Cover., PA-C  Omega-3 Fatty Acids (FISH OIL) 1000 MG CPDR Take 1 capsule by mouth daily.     [provider]  rosuvastatin (CRESTOR) 20 MG tablet Take 1 tablet by mouth daily. 08/22/19   [provider]  sotalol (BETAPACE) 160 MG tablet TAKE 1/2 TABLET TWICE DAILY 12/11/21   Mealor, Yetta Barre, MD      Allergies    Patient has no known allergies.    Review of Systems   Review of Systems  Neurological:  Positive for headaches.   Review of systems negative for CP, blurry vision, facial droop, asymmetric weakness/numbness/tingling.  A 10 point review of systems was performed and is negative unless otherwise reported in HPI.  Physical Exam Updated Vital Signs BP (!) 199/90   Pulse (!) 55   Temp 98.4 F (36.9 C)   Resp 13   Ht 6' (1.829 m)   Wt 81.6 kg   SpO2 99%   BMI 24.41 kg/m  Physical Exam General: Normal appearing male, lying in bed.  HEENT: Sclera anicteric, MMM, trachea midline. Cardiology: RRR,  no murmurs/rubs/gallops. BL radial and DP pulses equal bilaterally.  Resp: Normal respiratory rate and effort. CTAB, no wheezes, rhonchi, crackles.  Abd: Soft, non-tender, non-distended. No rebound tenderness or guarding.  GU: Deferred. MSK: No peripheral edema or signs of trauma. Extremities without deformity or TTP. No cyanosis or clubbing. Skin: warm, dry. No rashes or lesions. Neuro: A&Ox4, CNs II-XII grossly intact. MAEs. Sensation grossly intact.  Psych: Normal mood and affect.   ED Results / Procedures / Treatments   Labs (all labs ordered are listed,  but only abnormal results are displayed) Labs Reviewed  BASIC METABOLIC PANEL - Abnormal; Notable for the following components:      Result Value   Glucose, Bld 116 (*)    All other components within normal limits  CBC WITH DIFFERENTIAL/PLATELET    EKG EKG Interpretation  Date/Time:  Wednesday December 26 2021 14:53:44 EST Ventricular Rate:  55 PR Interval:  202 QRS Duration: 80 QT Interval:  438 QTC Calculation: 419 R Axis:   59 Text Interpretation: Sinus bradycardia Otherwise normal ECG When compared with ECG of 23-Oct-2021 09:19, Nonspecific T wave abnormality now evident in Anterior leads Confirmed by Cindee Lame [phone]) on 12/26/2021 4:00:42 PM  Radiology No results found.  Procedures Procedures    Medications Ordered in ED Medications  amLODipine (NORVASC) tablet 5 mg (5 mg Oral Given 12/26/21 1635)    ED Course/ Medical Decision Making/ A&P                          Medical Decision Making Risk Prescription drug management.   This patient presents to the ED for concern of HTN, this involves an extensive number of treatment options, and is a complaint that carries with it a high risk of complications and morbidity.  I considered the following differential and admission for this acute, potentially life threatening condition. However, patient is overall asymptomatic and do not believe he requires admission at this time pending lab evaluation.  MDM:    Patient is overall well appearing at this time. Blood pressure is 195/94 mmHg.   Patient does not have any symptoms on history nor signs on physical exam concerning for end organ damage secondary to hypertension.   Specifically, based up the patient's presentation, the patient is at sufficiently low risk for: -ACS given no CP, no SOB, normal cardio-pulmonary exam -SAH/stroke given no hx of acute headache/stroke like sxs and normal neurologic exam.  -end organ renal disease given no Cr abnormality  Patient is  given amlodipine 5 mg PO for their blood pressure.  The patient was counseled regarding the deleterious effects of hypertension and the necessity of follow up with the patient's primary physician to establish a care plan.  Instructed to f/u with PCP within 1 week. Will be discharged with discharge instructions/return precautions.   Labs: I Ordered, and personally interpreted labs.  The pertinent results include:  Labs demonstrate BMP within normal limits, no leukocytosis or anemia,.  EKG without ischemia.  Additional history obtained from family at bedside.   Cardiac Monitoring: The patient was maintained on a cardiac monitor.  I personally viewed and interpreted the cardiac monitored which showed an underlying rhythm of: NSR  Reevaluation: After the interventions noted above, I reevaluated the patient and found that they have :improved  Social Determinants of Health: Patient lives independently  Disposition:  Patient will be discharged with prescription for amlodipine 5 mg PO qd and explained to him the need to f/u  with a primary care physician. Patient reports understanding. All questions answered to patient's satisfaction. He will be DC'd w/ discharge instructions/return precautions  Co morbidities that complicate the patient evaluation  Past Medical History:  Diagnosis Date   Atrial flutter ([place])    s/p CTI ablation 10/20/09   Chronic lower back pain    Coronary artery disease    Dry age-related macular degeneration of right eye    Hay fever    High cholesterol    Hypertension    Hyperthyroidism    "never treated for this" (08/20/2017)   Paroxysmal atrial fibrillation ([place])    s/p PVI 10/20/09, 10/19/10   Pneumonia 1992     Medicines Meds ordered this encounter  Medications   amLODipine (NORVASC) 5 MG tablet    Sig: Take 1 tablet (5 mg total) by mouth daily.    Dispense:  30 tablet    Refill:  0   amLODipine (NORVASC) tablet 5 mg    I have reviewed the patients home  medicines and have made adjustments as needed  Problem List / ED Course: Problem List Items Addressed This Visit   None Visit Diagnoses     Asymptomatic hypertension    -  Primary   Relevant Medications   amLODipine (NORVASC) 5 MG tablet   amLODipine (NORVASC) tablet 5 mg (Completed)                This note was created using dictation software, which may contain spelling or grammatical errors.    Audley Hose, MD 01/15/22 1728

## 2021-12-26 NOTE — Telephone Encounter (Signed)
Spoke with patient's wife [name] and shared Dr. Karel Jarvis response:  It looks like he went to the ER and was prescribed new medication.  I am happy to address HTN or BP issues when patients see me in clinic, but it's not something I like to be the primary manager for. I would recommend he follow-up with his PCP.    [name] verbalized understanding and states she will contact Dr. Philip Aspen (PCP) to follow-up on BP and new Rx.  Carolyn expressed appreciation for follow-up.

## 2021-12-26 NOTE — Discharge Instructions (Addendum)
Thank you for coming to [facility] Emergency Department. You were seen for high blood pressure. We did an exam, labs, and imaging, and these showed no acute findings.  Please follow up with your primary care provider or cardiologist within 1 week in order to discuss this medication.  Check your blood pressure once per day in the afternoon after sitting calmly for 15 minutes. If the top number is >180 consistently on rechecks, especially associated with headache, chest pain, shortness of breath, palpitations, vision changes, or stroke-like symptoms, please seek medical care.   Do not hesitate to return to the ED or call 911 if you experience: -Worsening symptoms -Chest pain, shortness of breath -Lightheadedness, passing out -Fevers/chills -Anything else that concerns you

## 2021-12-26 NOTE — ED Triage Notes (Addendum)
Pt arrives via POV from home. Pt reports his blood pressure has been elevated for the past couple of days. Pt reports slight headache at times. Denies any other associated symptoms. Pt is AxOx4. Reports he takes his BP meds on an as need basis. Took lisinopril  yesterday and today and BP has remained high. Pt is on Eliquis. Hx of Afib and stroke.

## 2021-12-26 NOTE — ED Provider Triage Note (Signed)
Emergency Medicine Provider Triage Evaluation Note  John Sheppard. , a 81 y.o. male  was evaluated in triage.  Pt complains of elevated BP readings with out any symptoms. Hx CVA, on BP meds, checks BP regularly, has had elevated readings without any symptoms since around 12/22/21. Has been taking occasional extra lisinopril to tx this but concerned to continue with this. Called PCP but hasn't heard back.  Review of Systems  Positive: As above Negative: As above  Physical Exam  BP (!) 195/94   Pulse (!) 54   Temp 98.4 F (36.9 C)   Resp 15   Ht 6' (1.829 m)   Wt 81.6 kg   SpO2 99%   BMI 24.41 kg/m  Gen:   Awake, no distress   Resp:  Normal effort  MSK:   Moves extremities without difficulty  Other:    Medical Decision Making  Medically screening exam initiated at 2:43 PM.  Appropriate orders placed.  [employer] was informed that the remainder of the evaluation will be completed by another provider, this initial triage assessment does not replace that evaluation, and the importance of remaining in the ED until their evaluation is complete.     Tacy Learn, PA-C 12/26/21 1444

## 2021-12-26 NOTE — Telephone Encounter (Signed)
Returned call to patient's spouse John Sheppard.  John Sheppard reports patient's BP this morning at 7:45am was 184/96. Patient reports taking Lisinopril '20mg'$  at 8:00AM. BP 1 hour after taking Lisinopril was 220/105, then 2 hours after (during call) BP was 205/96.  John Sheppard states patient does not report any CP, SOB, dizziness, headache, or blurred vision. Denies any increased salt intake or increased stress.  BP reading last night was 159/84, patient took '20mg'$  of Lisinopril that morning and an additional '10mg'$  that evening.  John Sheppard reports patient's BP readings have been elevated over the past month. Denies any changes in diet, activity level or stress levels.  Will forward to Dr. Myles Gip to review and advise.

## 2021-12-26 NOTE — ED Notes (Signed)
Reviewed AVS/discharge instruction with patient. Time allotted for and all questions answered. Patient is agreeable for d/c and escorted to ed exit by staff.  

## 2021-12-31 DIAGNOSIS — I10 Essential (primary) hypertension: Secondary | ICD-10-CM | POA: Diagnosis not present

## 2021-12-31 DIAGNOSIS — E785 Hyperlipidemia, unspecified: Secondary | ICD-10-CM | POA: Diagnosis not present

## 2021-12-31 DIAGNOSIS — Z9861 Coronary angioplasty status: Secondary | ICD-10-CM | POA: Diagnosis not present

## 2021-12-31 DIAGNOSIS — I480 Paroxysmal atrial fibrillation: Secondary | ICD-10-CM | POA: Diagnosis not present

## 2022-01-07 DIAGNOSIS — H524 Presbyopia: Secondary | ICD-10-CM | POA: Diagnosis not present

## 2022-01-07 DIAGNOSIS — H52203 Unspecified astigmatism, bilateral: Secondary | ICD-10-CM | POA: Diagnosis not present

## 2022-01-07 DIAGNOSIS — H35371 Puckering of macula, right eye: Secondary | ICD-10-CM | POA: Diagnosis not present

## 2022-01-07 DIAGNOSIS — Z961 Presence of intraocular lens: Secondary | ICD-10-CM | POA: Diagnosis not present

## 2022-04-30 DIAGNOSIS — M25551 Pain in right hip: Secondary | ICD-10-CM | POA: Diagnosis not present

## 2022-04-30 DIAGNOSIS — M5416 Radiculopathy, lumbar region: Secondary | ICD-10-CM | POA: Diagnosis not present

## 2022-04-30 DIAGNOSIS — M5451 Vertebrogenic low back pain: Secondary | ICD-10-CM | POA: Diagnosis not present

## 2022-05-09 ENCOUNTER — Ambulatory Visit: Payer: Medicare HMO | Admitting: Cardiovascular Disease

## 2022-05-10 DIAGNOSIS — M5459 Other low back pain: Secondary | ICD-10-CM | POA: Diagnosis not present

## 2022-05-13 ENCOUNTER — Encounter: Payer: Self-pay | Admitting: Cardiovascular Disease

## 2022-05-13 ENCOUNTER — Ambulatory Visit: Payer: Medicare HMO | Attending: Cardiovascular Disease | Admitting: Cardiovascular Disease

## 2022-05-13 VITALS — BP 138/70 | HR 54 | Ht 72.0 in | Wt 179.0 lb

## 2022-05-13 DIAGNOSIS — I483 Typical atrial flutter: Secondary | ICD-10-CM

## 2022-05-13 NOTE — Progress Notes (Signed)
Cardiology Office Note:    Date:  05/13/2022   ID:  John Baldy., DOB 09-Apr-1940, MRN [id number]  PCP:  [name], MD   [place] [facility] Cardiologist:  [name], MD Electrophysiologist:  [name], MD     Referring MD: [name], MD   No chief complaint on file. Here for routine follow-up. Feel great  History of Present Illness:    John Brenneke. is a 82 y.o. male with a hx of HTN, hyperthyroid, CAD, CVA, atrial flutter, atrial fibrillation who presents for management of AF.  Maintained on sotalol and doing very well.  He has no complaints or side effects from the medication.  He has rare episodes of A-fib, maybe lasting a few minutes, occurring maybe 3 or 4 times a year.  At this point he feels well and does not wish to pursue any more aggressive interventions.  He has not had any shortness of breath, chest pain, syncope, presyncope, ankle edema.  Past Medical History:  Diagnosis Date   Atrial flutter    s/p CTI ablation 10/20/09   Chronic lower back pain    Coronary artery disease    Dry age-related macular degeneration of right eye    Hay fever    High cholesterol    Hypertension    Hyperthyroidism    "never treated for this" (08/20/2017)   Paroxysmal atrial fibrillation    s/p PVI 10/20/09, 10/19/10   Pneumonia 1992    Past Surgical History:  Procedure Laterality Date   ATRIAL ABLATION SURGERY  10/20/2009; 10/19/2010   PVI and CTI ablation by [facility]    CARDIOVERSION  2001   CATARACT EXTRACTION W/ INTRAOCULAR LENS  IMPLANT, BILATERAL Bilateral 08/2010   COLONOSCOPY     "~ 6 month after 1st colonoscopy; no polyps 2nd time"   COLONOSCOPY W/ BIOPSIES AND POLYPECTOMY     CORONARY STENT INTERVENTION N/A 08/20/2017   Procedure: CORONARY STENT INTERVENTION;  Surgeon: John Blanks, MD;  Location: [facility];  Service: Cardiovascular;  Laterality: N/A;   RIGHT/LEFT HEART CATH AND CORONARY ANGIOGRAPHY N/A  08/20/2017   Procedure: RIGHT/LEFT HEART CATH AND CORONARY ANGIOGRAPHY;  Surgeon: John Blanks, MD;  Location: [facility];  Service: Cardiovascular;  Laterality: N/A;   TRANSESOPHAGEAL ECHOCARDIOGRAM  10/2009   EF: 60-65%    Current Medications: Current Meds  Medication Sig   apixaban (ELIQUIS) 5 MG TABS tablet Take 1 tablet (5 mg total) by mouth 2 (two) times daily.   Cyanocobalamin (B-12) 2500 MCG TABS Take 1 tablet by mouth daily.   ferrous sulfate 325 (65 FE) MG tablet Take 325 mg by mouth daily with breakfast.   gabapentin (NEURONTIN) 300 MG capsule Take 1 capsule by mouth daily.   lisinopril (ZESTRIL) 10 MG tablet TAKE 1 TABLET AS NEEDED FOR ELEVATED BLOOD PRESSURE   meclizine (ANTIVERT) 25 MG tablet Take 1 tablet by mouth as needed for dizziness.   Multiple Vitamin (MULTIVITAMIN) tablet Take 1 tablet by mouth daily.     Multiple Vitamins-Minerals (PRESERVISION/LUTEIN PO) Take 1 capsule by mouth 2 (two) times daily.    Omega-3 Fatty Acids (FISH OIL) 1000 MG CPDR Take 1 capsule by mouth daily.    rosuvastatin (CRESTOR) 20 MG tablet Take 1 tablet by mouth daily.   sotalol (BETAPACE) 160 MG tablet TAKE 1/2 TABLET TWICE DAILY     Allergies:   Patient has no known allergies.   Social History   Socioeconomic History  Marital status: Married    Spouse name: John Sheppard   Number of children: 2   Years of education: 16   Highest education level: Not on file  Occupational History   Occupation: Retired  Tobacco Use   Smoking status: Never   Smokeless tobacco: Never   Tobacco comments:    Never smoke 10/23/21  Vaping Use   Vaping Use: Never used  Substance and Sexual Activity   Alcohol use: Yes    Alcohol/week: 2.0 standard drinks of alcohol    Types: 2 Cans of beer per week    Comment: beer occ. 10/23/21   Drug use: Never   Sexual activity: Yes  Other Topics Concern   Not on file  Social History Narrative   Lives w/ wife   Caffeine use:  Drinks tea/diet  pepsi daily   Social Determinants of Health   Financial Resource Strain: Not on file  Food Insecurity: Not on file  Transportation Needs: Not on file  Physical Activity: Not on file  Stress: Not on file  Social Connections: Not on file     Family History: The patient's family history includes Alzheimer's disease in his maternal grandfather and mother; Heart failure in his father; Stroke in his sister. There is no history of Colon cancer or Stomach cancer.  ROS:   Please see the history of present illness.     All other systems reviewed and are negative.  EKGs/Labs/Other Studies Reviewed:    The following studies were reviewed today: TTE 10/25/21 : Normal  EKG:  EKG is  ordered today.  The ekg ordered today demonstrates sinus bradycardia, rate 48 and normal QT interval  Recent Labs: 12/26/2021: BUN 16; Creatinine, Ser 0.84; Hemoglobin 14.2; Platelets 207; Potassium 4.3; Sodium 137  Recent Lipid Panel    Component Value Date/Time   CHOL 139 08/21/2017 0215   TRIG 98 08/21/2017 0215   HDL 36 (L) 08/21/2017 0215   CHOLHDL 3.9 08/21/2017 0215   VLDL 20 08/21/2017 0215   LDLCALC 83 08/21/2017 0215     Risk Assessment/Calculations:    CHA2DS2-VASc Score = 6   This indicates a 9.7% annual risk of stroke. The patient's score is based upon: CHF History: 0 HTN History: 1 Diabetes History: 0 Stroke History: 2 Vascular Disease History: 1 Age Score: 2 Gender Score: 0            Physical Exam:    VS:  BP 138/70   Pulse (!) 54   Ht 6' (1.829 m)   Wt 179 lb (81.2 kg)   SpO2 98%   BMI 24.28 kg/m     Wt Readings from Last 3 Encounters:  05/13/22 179 lb (81.2 kg)  12/26/21 180 lb (81.6 kg)  11/12/21 179 lb (81.2 kg)     GEN: Well nourished, well developed in no acute distress HEENT: Normal NECK: No JVD; No carotid bruits LYMPHATICS: No lymphadenopathy CARDIAC: RRR, no murmurs, rubs, gallops RESPIRATORY:  Clear to auscultation without rales, wheezing or  rhonchi  ABDOMEN: Soft, non-tender, non-distended MUSCULOSKELETAL:  No edema; No deformity  SKIN: Warm and dry NEUROLOGIC:  Alert and oriented x 3 PSYCHIATRIC:  Normal affect   ASSESSMENT:    1. Typical atrial flutter    PLAN:    In order of problems listed above:  Atrial fibrillation: paroxysmal. Having rare breakthrough episodes. Doing well on Tikosyn High risk medication: on Tikosyn with appropriate Qtc on ECG today. Labs reviewed 12/26/2021 Long term use of anticoagulation: continue eliquis 5mg  BID  Medication Adjustments/Labs and Tests Ordered: Current medicines are reviewed at length with the patient today.  Concerns regarding medicines are outlined above.  Orders Placed This Encounter  Procedures   EKG 12-Lead   No orders of the defined types were placed in this encounter.   There are no Patient Instructions on file for this visit.   Signed, [name], MD  05/13/2022 4:10 PM    [place]

## 2022-05-13 NOTE — Patient Instructions (Signed)
Medication Instructions:  Your physician recommends that you continue on your current medications as directed. Please refer to the Current Medication list given to you today. *If you need a refill on your cardiac medications before your next appointment, please call your pharmacy*   Follow-Up: With the [facility], you and your health needs are our priority.  As part of our continuing mission to provide you with exceptional heart care, we have created designated Provider Care Teams.  These Care Teams include your primary Cardiologist (physician) and Advanced Practice Providers (APPs -  Physician Assistants and Nurse Practitioners) who all work together to provide you with the care you need, when you need it.  We recommend signing up for the patient portal called "MyChart".  Sign up information is provided on this After Visit Summary.  MyChart is used to connect with patients for Virtual Visits (Telemedicine).  Patients are able to view lab/test results, encounter notes, upcoming appointments, etc.  Non-urgent messages can be sent to your provider as well.   To learn more about what you can do with MyChart, go to [email or web address].    Your next appointment:   6 month(s)  Provider:   You will follow up in the [facility] located at [facility]. Your provider will be: Clint R. Whitakers, [place]

## 2022-05-21 DIAGNOSIS — M5136 Other intervertebral disc degeneration, lumbar region: Secondary | ICD-10-CM | POA: Diagnosis not present

## 2022-05-21 DIAGNOSIS — M5416 Radiculopathy, lumbar region: Secondary | ICD-10-CM | POA: Diagnosis not present

## 2022-05-21 DIAGNOSIS — M47816 Spondylosis without myelopathy or radiculopathy, lumbar region: Secondary | ICD-10-CM | POA: Diagnosis not present

## 2022-05-24 DIAGNOSIS — M5451 Vertebrogenic low back pain: Secondary | ICD-10-CM | POA: Diagnosis not present

## 2022-05-29 DIAGNOSIS — M5451 Vertebrogenic low back pain: Secondary | ICD-10-CM | POA: Diagnosis not present

## 2022-05-31 DIAGNOSIS — M5451 Vertebrogenic low back pain: Secondary | ICD-10-CM | POA: Diagnosis not present

## 2022-06-04 DIAGNOSIS — M5451 Vertebrogenic low back pain: Secondary | ICD-10-CM | POA: Diagnosis not present

## 2022-06-10 DIAGNOSIS — M5451 Vertebrogenic low back pain: Secondary | ICD-10-CM | POA: Diagnosis not present

## 2022-06-13 DIAGNOSIS — M5451 Vertebrogenic low back pain: Secondary | ICD-10-CM | POA: Diagnosis not present

## 2022-06-18 ENCOUNTER — Other Ambulatory Visit (HOSPITAL_COMMUNITY): Payer: Self-pay | Admitting: Physician Assistant

## 2022-06-18 DIAGNOSIS — M5451 Vertebrogenic low back pain: Secondary | ICD-10-CM | POA: Diagnosis not present

## 2022-06-19 ENCOUNTER — Other Ambulatory Visit: Payer: Self-pay

## 2022-06-19 MED ORDER — LISINOPRIL 10 MG PO TABS: ORAL_TABLET | ORAL | 3 refills | Status: AC

## 2022-06-20 DIAGNOSIS — M5451 Vertebrogenic low back pain: Secondary | ICD-10-CM | POA: Diagnosis not present

## 2022-07-04 DIAGNOSIS — E785 Hyperlipidemia, unspecified: Secondary | ICD-10-CM | POA: Diagnosis not present

## 2022-07-04 DIAGNOSIS — R7989 Other specified abnormal findings of blood chemistry: Secondary | ICD-10-CM | POA: Diagnosis not present

## 2022-07-04 DIAGNOSIS — I10 Essential (primary) hypertension: Secondary | ICD-10-CM | POA: Diagnosis not present

## 2022-07-10 DIAGNOSIS — Z1331 Encounter for screening for depression: Secondary | ICD-10-CM | POA: Diagnosis not present

## 2022-07-10 DIAGNOSIS — I6930 Unspecified sequelae of cerebral infarction: Secondary | ICD-10-CM | POA: Diagnosis not present

## 2022-07-10 DIAGNOSIS — Z9861 Coronary angioplasty status: Secondary | ICD-10-CM | POA: Diagnosis not present

## 2022-07-10 DIAGNOSIS — Z0000 Encounter for general adult medical examination without abnormal findings: Secondary | ICD-10-CM | POA: Diagnosis not present

## 2022-07-10 DIAGNOSIS — Z1339 Encounter for screening examination for other mental health and behavioral disorders: Secondary | ICD-10-CM | POA: Diagnosis not present

## 2022-07-10 DIAGNOSIS — I10 Essential (primary) hypertension: Secondary | ICD-10-CM | POA: Diagnosis not present

## 2022-07-10 DIAGNOSIS — E785 Hyperlipidemia, unspecified: Secondary | ICD-10-CM | POA: Diagnosis not present

## 2022-07-10 DIAGNOSIS — I480 Paroxysmal atrial fibrillation: Secondary | ICD-10-CM | POA: Diagnosis not present

## 2022-07-10 DIAGNOSIS — G8929 Other chronic pain: Secondary | ICD-10-CM | POA: Diagnosis not present

## 2022-07-10 DIAGNOSIS — R82998 Other abnormal findings in urine: Secondary | ICD-10-CM | POA: Diagnosis not present

## 2022-07-29 DIAGNOSIS — M5451 Vertebrogenic low back pain: Secondary | ICD-10-CM | POA: Diagnosis not present

## 2022-11-07 DIAGNOSIS — M5416 Radiculopathy, lumbar region: Secondary | ICD-10-CM | POA: Diagnosis not present

## 2022-11-12 ENCOUNTER — Encounter (HOSPITAL_COMMUNITY): Payer: Self-pay | Admitting: Physician Assistant

## 2022-11-12 ENCOUNTER — Ambulatory Visit (HOSPITAL_COMMUNITY)
Admission: RE | Admit: 2022-11-12 | Discharge: 2022-11-12 | Disposition: A | Discharge status: Home / Self Care | Payer: Medicare HMO | Source: Ambulatory Visit | Attending: Physician Assistant | Admitting: Physician Assistant

## 2022-11-12 VITALS — BP 140/80 | HR 48 | Ht 72.0 in | Wt 174.2 lb

## 2022-11-12 DIAGNOSIS — D6869 Other thrombophilia: Secondary | ICD-10-CM

## 2022-11-12 DIAGNOSIS — I2510 Atherosclerotic heart disease of native coronary artery without angina pectoris: Secondary | ICD-10-CM | POA: Diagnosis not present

## 2022-11-12 DIAGNOSIS — I10 Essential (primary) hypertension: Secondary | ICD-10-CM | POA: Insufficient documentation

## 2022-11-12 DIAGNOSIS — Z79899 Other long term (current) drug therapy: Secondary | ICD-10-CM | POA: Diagnosis not present

## 2022-11-12 DIAGNOSIS — Z5181 Encounter for therapeutic drug level monitoring: Secondary | ICD-10-CM | POA: Diagnosis not present

## 2022-11-12 DIAGNOSIS — I4892 Unspecified atrial flutter: Secondary | ICD-10-CM | POA: Diagnosis not present

## 2022-11-12 DIAGNOSIS — Z7901 Long term (current) use of anticoagulants: Secondary | ICD-10-CM | POA: Diagnosis not present

## 2022-11-12 DIAGNOSIS — I480 Paroxysmal atrial fibrillation: Secondary | ICD-10-CM

## 2022-11-12 LAB — BASIC METABOLIC PANEL
Anion gap: 7 (ref 5–15)
BUN: 12 mg/dL (ref 8–23)
CO2: 25 mmol/L (ref 22–32)
Calcium: 8.8 mg/dL — ABNORMAL LOW (ref 8.9–10.3)
Chloride: 104 mmol/L (ref 98–111)
Creatinine, Ser: 0.89 mg/dL (ref 0.61–1.24)
GFR, Estimated: >60 mL/min (ref >60)
Glucose, Bld: 94 mg/dL (ref 70–99)
Potassium: 4.3 mmol/L (ref 3.5–5.1)
Sodium: 136 mmol/L (ref 135–145)

## 2022-11-12 LAB — MAGNESIUM: Magnesium: 2.1 mg/dL (ref 1.7–2.4)

## 2022-11-12 NOTE — Progress Notes (Signed)
Primary Care Physician: John Fillers, MD Primary Cardiologist: none Primary Electrophysiologist: Dr John Sheppard  Referring Physician: Otilio Saber PA   John Dys. is a 82 y.o. male with a history of HTN, hyperthyroid, CAD, CVA, atrial flutter, atrial fibrillation who presents for follow up in the [facility].  The patient was initially diagnosed with atrial fibrillation remotely and had an afib and flutter ablation in 2011 and repeat ablation in 2012. He has been maintained on sotalol. Patient has a CHADS2VASC score of 6 but has historically declined OAC. Patient was seen at the ED 09/05/21 for weakness, headache, and having difficulty interpreting time on his watch. CT of head was unremarkable. He saw Dr John Sheppard of neurology as an outpatient who ordered an MRI which showed right frontal parietal CVA. Started on Eliquis.  On follow up today, patient reports that he has done well since his last visit. He did have 3-4 brief episodes of afib in a 24 hour period about 2 months ago but otherwise has been staying in SR. No bleeding issues on anticoagulation.   Today, he denies symptoms of palpitations, chest pain, shortness of breath, orthopnea, PND, lower extremity edema, dizziness, presyncope, syncope, snoring, daytime somnolence, bleeding, or neurologic sequela. The patient is tolerating medications without difficulties and is otherwise without complaint today.    Atrial Fibrillation Risk Factors:  he does not have symptoms or diagnosis of sleep apnea. he does not have a history of rheumatic fever.   Atrial Fibrillation Management history:  Previous antiarrhythmic drugs: sotalol  Previous cardioversions: 2001 Previous ablations: 2011 fib and flutter, 2012 Anticoagulation history: warfarin   Past Medical History:  Diagnosis Date   Atrial flutter (HCC)    s/p CTI ablation 10/20/09   Chronic lower back pain    Coronary artery disease    Dry age-related  macular degeneration of right eye    Hay fever    High cholesterol    Hypertension    Hyperthyroidism    "never treated for this" (08/20/2017)   Paroxysmal atrial fibrillation (HCC)    s/p PVI 10/20/09, 10/19/10   Pneumonia 1992    Current Outpatient Medications  Medication Sig Dispense Refill   amLODipine (NORVASC) 5 MG tablet Take 1 tablet (5 mg total) by mouth daily. 30 tablet 0   Cyanocobalamin (B-12) 2500 MCG TABS Take 1 tablet by mouth daily.     ELIQUIS 5 MG TABS tablet TAKE 1 TABLET TWICE DAILY 180 tablet 3   ferrous sulfate 325 (65 FE) MG tablet Take 325 mg by mouth daily with breakfast.     gabapentin (NEURONTIN) 300 MG capsule Take 1 capsule by mouth daily.     lisinopril (ZESTRIL) 10 MG tablet TAKE 1 TABLET AS NEEDED FOR ELEVATED BLOOD PRESSURE 90 tablet 3   meclizine (ANTIVERT) 25 MG tablet Take 1 tablet by mouth as needed for dizziness.     Multiple Vitamin (MULTIVITAMIN) tablet Take 1 tablet by mouth daily.       Multiple Vitamins-Minerals (PRESERVISION/LUTEIN PO) Take 1 capsule by mouth 2 (two) times daily.      nitroGLYCERIN (NITROSTAT) 0.4 MG SL tablet Place 1 tablet (0.4 mg total) under the tongue every 5 (five) minutes as needed for chest pain. 25 tablet 3   Omega-3 Fatty Acids (FISH OIL) 1000 MG CPDR Take 1 capsule by mouth daily.      rosuvastatin (CRESTOR) 20 MG tablet Take 1 tablet by mouth daily.     sotalol (BETAPACE) 160  MG tablet TAKE 1/2 TABLET TWICE DAILY 90 tablet 3   No current facility-administered medications for this encounter.    ROS- All systems are reviewed and negative except as per the HPI above.  Physical Exam: Vitals:   11/12/22 1130  BP: (!) 140/80  Pulse: (!) 48  Weight: 79 kg  Height: 6' (1.829 m)    GEN: Well nourished, well developed in no acute distress NECK: No JVD; No carotid bruits CARDIAC: Regular rate and rhythm, no murmurs, rubs, gallops RESPIRATORY:  Clear to auscultation without rales, wheezing or rhonchi  ABDOMEN: Soft,  non-tender, non-distended EXTREMITIES:  No edema; No deformity    Wt Readings from Last 3 Encounters:  11/12/22 79 kg  05/13/22 81.2 kg  12/26/21 81.6 kg    EKG today demonstrates  SB, 1st degree AV block Vent. rate 48 BPM PR interval 222 ms QRS duration 80 ms QT/QTcB 442/394 ms   Echo 06/30/17 demonstrated  Left ventricle: The cavity size was normal. Wall thickness was    increased in a pattern of mild LVH. Systolic function was normal.    The estimated ejection fraction was in the range of 55% to 60%.    Wall motion was normal; there were no regional wall motion    abnormalities. Doppler parameters are consistent with abnormal    left ventricular relaxation (grade 1 diastolic dysfunction).  - Aortic valve: There was trivial regurgitation.   Impressions:   - Normal LV systolic function; mild LVH; mild diastolic    dysfunction; trace AI and MR.   Epic records are reviewed at length today  CHA2DS2-VASc Score = 6  The patient's score is based upon: CHF History: 0 HTN History: 1 Diabetes History: 0 Stroke History: 2 Vascular Disease History: 1 Age Score: 2 Gender Score: 0         ASSESSMENT AND PLAN: Paroxysmal Atrial Fibrillation/atrial flutter The patient's CHA2DS2-VASc score is 6, indicating a 9.7% annual risk of stroke.   S/p afib and flutter ablation 2011, repeat ablation 2012 Patient appears to be maintaining SR Continue Eliquis 5 mg BID  Continue ASA 81 mg daily with h/o DES to LAD Continue sotalol 80 mg BID, QT stable Check bmet/mag today  Secondary Hypercoagulable State (ICD10:  D68.69) The patient is at significant risk for stroke/thromboembolism based upon his CHA2DS2-VASc Score of 6.  Continue Apixaban (Eliquis).   CAD No anginal symptoms  HTN Stable on current regimen   Follow up in the AF clinic in 6 months.    John Loa PA-C [facility] [address] [place], [place]  [address] [phone] 11/12/2022 11:52 AM

## 2022-12-03 ENCOUNTER — Other Ambulatory Visit: Payer: Self-pay | Admitting: Cardiovascular Disease

## 2023-01-13 DIAGNOSIS — H35371 Puckering of macula, right eye: Secondary | ICD-10-CM | POA: Diagnosis not present

## 2023-01-13 DIAGNOSIS — H52203 Unspecified astigmatism, bilateral: Secondary | ICD-10-CM | POA: Diagnosis not present

## 2023-01-13 DIAGNOSIS — Z961 Presence of intraocular lens: Secondary | ICD-10-CM | POA: Diagnosis not present

## 2023-05-06 ENCOUNTER — Encounter (HOSPITAL_COMMUNITY): Payer: Self-pay | Admitting: Physician Assistant

## 2023-05-06 ENCOUNTER — Ambulatory Visit (HOSPITAL_COMMUNITY)
Admission: RE | Admit: 2023-05-06 | Discharge: 2023-05-06 | Disposition: A | Discharge status: Home / Self Care | Payer: Medicare HMO | Source: Ambulatory Visit | Attending: Physician Assistant | Admitting: Physician Assistant

## 2023-05-06 VITALS — BP 136/72 | HR 53 | Ht 72.0 in | Wt 176.8 lb

## 2023-05-06 DIAGNOSIS — I110 Hypertensive heart disease with heart failure: Secondary | ICD-10-CM | POA: Insufficient documentation

## 2023-05-06 DIAGNOSIS — Z7901 Long term (current) use of anticoagulants: Secondary | ICD-10-CM | POA: Diagnosis not present

## 2023-05-06 DIAGNOSIS — I480 Paroxysmal atrial fibrillation: Secondary | ICD-10-CM | POA: Diagnosis not present

## 2023-05-06 DIAGNOSIS — Z955 Presence of coronary angioplasty implant and graft: Secondary | ICD-10-CM | POA: Insufficient documentation

## 2023-05-06 DIAGNOSIS — Z5181 Encounter for therapeutic drug level monitoring: Secondary | ICD-10-CM | POA: Diagnosis not present

## 2023-05-06 DIAGNOSIS — I2510 Atherosclerotic heart disease of native coronary artery without angina pectoris: Secondary | ICD-10-CM | POA: Insufficient documentation

## 2023-05-06 DIAGNOSIS — D6869 Other thrombophilia: Secondary | ICD-10-CM | POA: Insufficient documentation

## 2023-05-06 DIAGNOSIS — I4892 Unspecified atrial flutter: Secondary | ICD-10-CM | POA: Diagnosis not present

## 2023-05-06 DIAGNOSIS — Z79899 Other long term (current) drug therapy: Secondary | ICD-10-CM | POA: Insufficient documentation

## 2023-05-06 LAB — CBC
HCT: 41.6 % (ref 39.0–52.0)
Hemoglobin: 13.6 g/dL (ref 13.0–17.0)
MCH: 32.6 pg (ref 26.0–34.0)
MCHC: 32.7 g/dL (ref 30.0–36.0)
MCV: 99.8 fL (ref 80.0–100.0)
Platelets: 185 10*3/uL (ref 150–400)
RBC: 4.17 MIL/uL — ABNORMAL LOW (ref 4.22–5.81)
RDW: 13 % (ref 11.5–15.5)
WBC: 7.4 10*3/uL (ref 4.0–10.5)
nRBC: 0 % (ref 0.0–0.2)

## 2023-05-06 LAB — BASIC METABOLIC PANEL
Anion gap: 8 (ref 5–15)
BUN: 13 mg/dL (ref 8–23)
CO2: 22 mmol/L (ref 22–32)
Calcium: 9 mg/dL (ref 8.9–10.3)
Chloride: 108 mmol/L (ref 98–111)
Creatinine, Ser: 0.86 mg/dL (ref 0.61–1.24)
GFR, Estimated: >60 mL/min (ref >60)
Glucose, Bld: 125 mg/dL — ABNORMAL HIGH (ref 70–99)
Potassium: 4.3 mmol/L (ref 3.5–5.1)
Sodium: 138 mmol/L (ref 135–145)

## 2023-05-06 LAB — MAGNESIUM: Magnesium: 2.1 mg/dL (ref 1.7–2.4)

## 2023-05-06 NOTE — Progress Notes (Signed)
 Primary Care Physician: Garlan Fillers, MD Primary Cardiologist: none Primary Electrophysiologist: Dr Nelly Laurence  Referring Physician: Otilio Saber PA   John Dys. is a 83 y.o. male with a history of HTN, hyperthyroid, CAD, CVA, atrial flutter, atrial fibrillation who presents for follow up in the [facility].  The patient was initially diagnosed with atrial fibrillation remotely and had an afib and flutter ablation in 2011 and repeat ablation in 2012. He has been maintained on sotalol. Patient has a CHADS2VASC score of 6 but has historically declined OAC. Patient was seen at the ED 09/05/21 for weakness, headache, and having difficulty interpreting time on his watch. CT of head was unremarkable. He saw Dr Lucia Gaskins of neurology as an outpatient who ordered an MRI which showed right frontal parietal CVA. Started on Eliquis.  Patient returns for follow up for atrial fibrillation and sotalol monitoring. He reports that he has done well since his last visit. He has only had one episode of symptomatic afib in the past year, lasting 1-2 hours. No bleeding issues on anticoagulation.   Today, he denies symptoms of palpitations, chest pain, shortness of breath, orthopnea, PND, lower extremity edema, dizziness, presyncope, syncope, snoring, daytime somnolence, bleeding, or neurologic sequela. The patient is tolerating medications without difficulties and is otherwise without complaint today.    Atrial Fibrillation Risk Factors:  he does not have symptoms or diagnosis of sleep apnea. he does not have a history of rheumatic fever.   Atrial Fibrillation Management history:  Previous antiarrhythmic drugs: sotalol  Previous cardioversions: 2001 Previous ablations: 2011 fib and flutter, 2012 Anticoagulation history: warfarin   Past Medical History:  Diagnosis Date   Atrial flutter (HCC)    s/p CTI ablation 10/20/09   Chronic lower back pain    Coronary artery  disease    Dry age-related macular degeneration of right eye    Hay fever    High cholesterol    Hypertension    Hyperthyroidism    "never treated for this" (08/20/2017)   Paroxysmal atrial fibrillation (HCC)    s/p PVI 10/20/09, 10/19/10   Pneumonia 1992    Current Outpatient Medications  Medication Sig Dispense Refill   amLODipine (NORVASC) 5 MG tablet Take 1 tablet (5 mg total) by mouth daily. 30 tablet 0   Cyanocobalamin (B-12) 2500 MCG TABS Take 1 tablet by mouth daily.     ELIQUIS 5 MG TABS tablet TAKE 1 TABLET TWICE DAILY 180 tablet 3   ferrous sulfate 325 (65 FE) MG tablet Take 325 mg by mouth daily with breakfast.     gabapentin (NEURONTIN) 300 MG capsule Take 1 capsule by mouth daily.     lisinopril (ZESTRIL) 10 MG tablet TAKE 1 TABLET AS NEEDED FOR ELEVATED BLOOD PRESSURE 90 tablet 3   meclizine (ANTIVERT) 25 MG tablet Take 1 tablet by mouth as needed for dizziness.     Multiple Vitamin (MULTIVITAMIN) tablet Take 1 tablet by mouth daily.       Multiple Vitamins-Minerals (PRESERVISION/LUTEIN PO) Take 1 capsule by mouth 2 (two) times daily.      nitroGLYCERIN (NITROSTAT) 0.4 MG SL tablet Place 1 tablet (0.4 mg total) under the tongue every 5 (five) minutes as needed for chest pain. 25 tablet 3   Omega-3 Fatty Acids (FISH OIL) 1000 MG CPDR Take 1 capsule by mouth daily.      rosuvastatin (CRESTOR) 20 MG tablet Take 1 tablet by mouth daily.     sotalol (BETAPACE) 160 MG  tablet TAKE 1/2 TABLET TWICE DAILY 90 tablet 3   No current facility-administered medications for this encounter.    ROS- All systems are reviewed and negative except as per the HPI above.  Physical Exam: Vitals:   05/06/23 1026  BP: 136/72  Pulse: (!) 53  Weight: 80.2 kg  Height: 6' (1.829 m)     GEN: Well nourished, well developed in no acute distress CARDIAC: Regular rate and rhythm, no murmurs, rubs, gallops RESPIRATORY:  Clear to auscultation without rales, wheezing or rhonchi  ABDOMEN: Soft,  non-tender, non-distended EXTREMITIES:  No edema; No deformity    Wt Readings from Last 3 Encounters:  05/06/23 80.2 kg  11/12/22 79 kg  05/13/22 81.2 kg    EKG today demonstrates  SB, 1st degree AV block Vent. rate 53 BPM PR interval 202 ms QRS duration 96 ms QT/QTcB 438/410 ms   Echo 06/30/17 demonstrated  Left ventricle: The cavity size was normal. Wall thickness was    increased in a pattern of mild LVH. Systolic function was normal.    The estimated ejection fraction was in the range of 55% to 60%.    Wall motion was normal; there were no regional wall motion    abnormalities. Doppler parameters are consistent with abnormal    left ventricular relaxation (grade 1 diastolic dysfunction).  - Aortic valve: There was trivial regurgitation.   Impressions:   - Normal LV systolic function; mild LVH; mild diastolic    dysfunction; trace AI and MR.   Epic records are reviewed at length today  CHA2DS2-VASc Score = 6  The patient's score is based upon: CHF History: 0 HTN History: 1 Diabetes History: 0 Stroke History: 2 Vascular Disease History: 1 Age Score: 2 Gender Score: 0       ASSESSMENT AND PLAN: Paroxysmal Atrial Fibrillation/atrial flutter The patient's CHA2DS2-VASc score is 6, indicating a 9.7% annual risk of stroke.   S/p afib and flutter ablation 2011, repeat ablation 2012 Patient appears to be maintaining SR.  Continue Eliquis 5 mg BID Continue sotalol 80 mg BID  Secondary Hypercoagulable State (ICD10:  D68.69) The patient is at significant risk for stroke/thromboembolism based upon his CHA2DS2-VASc Score of 6.  Continue Apixaban (Eliquis). No bleeding issues.  High Risk Medication Monitoring (ICD 10: Z79.899) QT interval on ECG acceptable for sotalol monitoring. Check bmet/mag/cbc today.   CAD H/o DES to LAD No anginal symptoms  HTN Stable on current regimen   Follow up in the AF clinic in 6 months.    [name] PA-C [facility] [address] [place], [place] [address] [phone] 05/06/2023 10:43 AM

## 2023-05-26 DIAGNOSIS — I480 Paroxysmal atrial fibrillation: Secondary | ICD-10-CM | POA: Diagnosis not present

## 2023-05-26 DIAGNOSIS — H9202 Otalgia, left ear: Secondary | ICD-10-CM | POA: Diagnosis not present

## 2023-05-26 DIAGNOSIS — I10 Essential (primary) hypertension: Secondary | ICD-10-CM | POA: Diagnosis not present

## 2023-05-26 DIAGNOSIS — H9192 Unspecified hearing loss, left ear: Secondary | ICD-10-CM | POA: Diagnosis not present

## 2023-05-26 DIAGNOSIS — H65192 Other acute nonsuppurative otitis media, left ear: Secondary | ICD-10-CM | POA: Diagnosis not present

## 2023-07-08 DIAGNOSIS — E785 Hyperlipidemia, unspecified: Secondary | ICD-10-CM | POA: Diagnosis not present

## 2023-07-08 DIAGNOSIS — I10 Essential (primary) hypertension: Secondary | ICD-10-CM | POA: Diagnosis not present

## 2023-07-08 DIAGNOSIS — Z125 Encounter for screening for malignant neoplasm of prostate: Secondary | ICD-10-CM | POA: Diagnosis not present

## 2023-07-15 DIAGNOSIS — I6930 Unspecified sequelae of cerebral infarction: Secondary | ICD-10-CM | POA: Diagnosis not present

## 2023-07-15 DIAGNOSIS — D6869 Other thrombophilia: Secondary | ICD-10-CM | POA: Diagnosis not present

## 2023-07-15 DIAGNOSIS — E785 Hyperlipidemia, unspecified: Secondary | ICD-10-CM | POA: Diagnosis not present

## 2023-07-15 DIAGNOSIS — Z1339 Encounter for screening examination for other mental health and behavioral disorders: Secondary | ICD-10-CM | POA: Diagnosis not present

## 2023-07-15 DIAGNOSIS — I2510 Atherosclerotic heart disease of native coronary artery without angina pectoris: Secondary | ICD-10-CM | POA: Diagnosis not present

## 2023-07-15 DIAGNOSIS — I480 Paroxysmal atrial fibrillation: Secondary | ICD-10-CM | POA: Diagnosis not present

## 2023-07-15 DIAGNOSIS — I10 Essential (primary) hypertension: Secondary | ICD-10-CM | POA: Diagnosis not present

## 2023-07-15 DIAGNOSIS — Z0000 Encounter for general adult medical examination without abnormal findings: Secondary | ICD-10-CM | POA: Diagnosis not present

## 2023-07-15 DIAGNOSIS — G8929 Other chronic pain: Secondary | ICD-10-CM | POA: Diagnosis not present

## 2023-07-29 ENCOUNTER — Other Ambulatory Visit (HOSPITAL_COMMUNITY): Payer: Self-pay | Admitting: *Deleted

## 2023-07-29 MED ORDER — APIXABAN 5 MG PO TABS: 5.0000 mg | ORAL_TABLET | Freq: Two times a day (BID) | ORAL | 3 refills | Status: AC

## 2023-10-13 ENCOUNTER — Other Ambulatory Visit: Payer: Self-pay | Admitting: Cardiovascular Disease

## 2023-11-04 ENCOUNTER — Ambulatory Visit (HOSPITAL_COMMUNITY)
Admission: RE | Admit: 2023-11-04 | Discharge: 2023-11-04 | Disposition: A | Discharge status: Home / Self Care | Source: Ambulatory Visit | Attending: Physician Assistant | Admitting: Physician Assistant

## 2023-11-04 VITALS — BP 132/70 | HR 44 | Ht 72.0 in | Wt 174.6 lb

## 2023-11-04 DIAGNOSIS — D6869 Other thrombophilia: Secondary | ICD-10-CM | POA: Diagnosis not present

## 2023-11-04 DIAGNOSIS — I4891 Unspecified atrial fibrillation: Secondary | ICD-10-CM | POA: Diagnosis not present

## 2023-11-04 DIAGNOSIS — Z5181 Encounter for therapeutic drug level monitoring: Secondary | ICD-10-CM

## 2023-11-04 DIAGNOSIS — I483 Typical atrial flutter: Secondary | ICD-10-CM | POA: Diagnosis not present

## 2023-11-04 DIAGNOSIS — I480 Paroxysmal atrial fibrillation: Secondary | ICD-10-CM | POA: Diagnosis not present

## 2023-11-04 DIAGNOSIS — Z79899 Other long term (current) drug therapy: Secondary | ICD-10-CM | POA: Diagnosis not present

## 2023-11-04 NOTE — Progress Notes (Signed)
 Primary Care Physician: Yolande Toribio MATSU, MD Primary Cardiologist: none Primary Electrophysiologist: Dr Nancey  Referring Physician: Jodie Passey PA   John Sheppard. is a 83 y.o. male with a history of HTN, hyperthyroid, CAD, CVA, atrial flutter, atrial fibrillation who presents for follow up in the [facility].  The patient was initially diagnosed with atrial fibrillation remotely and had an afib and flutter ablation in 2011 and repeat ablation in 2012. He has been maintained on sotalol . Patient has a CHADS2VASC score of 6 but had historically declined OAC. Patient was seen at the ED 09/05/21 for weakness, headache, and having difficulty interpreting time on his watch. CT of head was unremarkable. He saw Dr Ines of neurology as an outpatient who ordered an MRI which showed right frontal parietal CVA. Started on Eliquis .  Patient returns for follow up for atrial fibrillation and sotalol  monitoring. He was in afib last night and this morning but converted in office. He states that he has episodes of afib about once every 2-3 months and the typically convert after a few hours. He does have some brief dizziness when he converts. There were no specific triggers that he could identify. No bleeding issues on anticoagulation.   Today, he  denies symptoms of chest pain, shortness of breath, orthopnea, PND, lower extremity edema, presyncope, syncope, snoring, daytime somnolence, bleeding, or neurologic sequela. The patient is tolerating medications without difficulties and is otherwise without complaint today.    Atrial Fibrillation Risk Factors:  he does not have symptoms or diagnosis of sleep apnea. he does not have a history of rheumatic fever.   Atrial Fibrillation Management history:  Previous antiarrhythmic drugs: sotalol   Previous cardioversions: 2001 Previous ablations: 2011 fib and flutter, 2012 Anticoagulation history: warfarin, Eliquis    Past  Medical History:  Diagnosis Date   Atrial flutter (HCC)    s/p CTI ablation 10/20/09   Chronic lower back pain    Coronary artery disease    Dry age-related macular degeneration of right eye    Hay fever    High cholesterol    Hypertension    Hyperthyroidism    never treated for this (08/20/2017)   Paroxysmal atrial fibrillation (HCC)    s/p PVI 10/20/09, 10/19/10   Pneumonia 1992    Current Outpatient Medications  Medication Sig Dispense Refill   amLODipine  (NORVASC ) 5 MG tablet Take 1 tablet (5 mg total) by mouth daily. 30 tablet 0   apixaban  (ELIQUIS ) 5 MG TABS tablet Take 1 tablet (5 mg total) by mouth 2 (two) times daily. 180 tablet 3   Cyanocobalamin (B-12) 2500 MCG TABS Take 1 tablet by mouth daily.     ferrous sulfate  325 (65 FE) MG tablet Take 325 mg by mouth daily with breakfast.     gabapentin (NEURONTIN) 300 MG capsule Take 1 capsule by mouth daily. (Patient taking differently: Take 1 capsule by mouth as needed.)     lisinopril  (ZESTRIL ) 10 MG tablet TAKE 1 TABLET AS NEEDED FOR ELEVATED BLOOD PRESSURE 90 tablet 3   meclizine (ANTIVERT) 25 MG tablet Take 1 tablet by mouth as needed for dizziness.     Multiple Vitamin (MULTIVITAMIN) tablet Take 1 tablet by mouth daily.       Multiple Vitamins-Minerals (PRESERVISION/LUTEIN PO) Take 1 capsule by mouth 2 (two) times daily.      nitroGLYCERIN  (NITROSTAT ) 0.4 MG SL tablet Place 1 tablet (0.4 mg total) under the tongue every 5 (five) minutes as needed for chest pain.  25 tablet 3   Omega-3 Fatty Acids (FISH OIL) 1000 MG CPDR Take 1 capsule by mouth daily.      rosuvastatin (CRESTOR) 20 MG tablet Take 1 tablet by mouth daily.     sotalol  (BETAPACE ) 160 MG tablet TAKE 1/2 TABLET TWICE DAILY 30 tablet 0   No current facility-administered medications for this encounter.    ROS- All systems are reviewed and negative except as per the HPI above.  Physical Exam: Vitals:   11/04/23 1059  BP: 132/70  Pulse: (!) 44  Weight: 79.2 kg   Height: 6' (1.829 m)     GEN: Well nourished, well developed in no acute distress CARDIAC: Regular rate and rhythm, no murmurs, rubs, gallops RESPIRATORY:  Clear to auscultation without rales, wheezing or rhonchi  ABDOMEN: Soft, non-tender, non-distended EXTREMITIES:  No edema; No deformity    Wt Readings from Last 3 Encounters:  11/04/23 79.2 kg  05/06/23 80.2 kg  11/12/22 79 kg    EKG #1 demonstrates  Atrial flutter with variable block Vent. rate 77 BPM PR interval * ms QRS duration 86 ms QT/QTcB 370/418 ms  EKG #2 demonstrates SB, PAC Vent. rate 44 BPM PR interval 202 ms QRS duration 88 ms QT/QTcB 420/359 ms   Echo 06/30/17 demonstrated  Left ventricle: The cavity size was normal. Wall thickness was    increased in a pattern of mild LVH. Systolic function was normal.    The estimated ejection fraction was in the range of 55% to 60%.    Wall motion was normal; there were no regional wall motion    abnormalities. Doppler parameters are consistent with abnormal    left ventricular relaxation (grade 1 diastolic dysfunction).  - Aortic valve: There was trivial regurgitation.   Impressions:   - Normal LV systolic function; mild LVH; mild diastolic    dysfunction; trace AI and MR.   Epic records are reviewed at length today   CHA2DS2-VASc Score = 6  The patient's score is based upon: CHF History: 0 HTN History: 1 Diabetes History: 0 Stroke History: 2 Vascular Disease History: 1 Age Score: 2 Gender Score: 0       ASSESSMENT AND PLAN: Paroxysmal Atrial Fibrillation/atrial flutter (ICD10:  I48.0) The patient's CHA2DS2-VASc score is 6, indicating a 9.7% annual risk of stroke.   S/p afib and flutter ablation 2011, repeat ablation 2012. We discussed rhythm control options today. For now, he is happy with his present rhythm control. If his episodes become more frequent, he would consider repeat ablation.  Continue sotalol  80 mg BID Continue Eliquis  5 mg  BID  Secondary Hypercoagulable State (ICD10:  D68.69) The patient is at significant risk for stroke/thromboembolism based upon his CHA2DS2-VASc Score of 6.  Continue Apixaban  (Eliquis ). No bleeding issues.   High Risk Medication Monitoring (ICD 10: [id number]) Patient requires ongoing monitoring for anti-arrhythmic medication which has the potential to cause life threatening arrhythmias. QT interval on ECG acceptable for sotalol  monitoring. Check bmet/mag today.     CAD H/o DES to LAD No anginal symptoms  HTN Stable on current regimen   Follow up with Dr Nancey in 6 months. AF clinic in one year.    Daril Kicks PA-C [facility] [address] [place], [place] [address] [phone] 11/04/2023 11:25 AM

## 2023-11-05 ENCOUNTER — Ambulatory Visit (HOSPITAL_COMMUNITY): Payer: Self-pay | Admitting: Physician Assistant

## 2023-11-05 LAB — BASIC METABOLIC PANEL WITH GFR
BUN/Creatinine Ratio: 16 (ref 10–24)
BUN: 14 mg/dL (ref 8–27)
CO2: 21 mmol/L (ref 20–29)
Calcium: 9.2 mg/dL (ref 8.6–10.2)
Chloride: 104 mmol/L (ref 96–106)
Creatinine, Ser: 0.88 mg/dL (ref 0.76–1.27)
Glucose: 70 mg/dL (ref 70–99)
Potassium: 5 mmol/L (ref 3.5–5.2)
Sodium: 140 mmol/L (ref 134–144)
eGFR: 86 mL/min/1.73 (ref >59)

## 2023-11-05 LAB — MAGNESIUM: Magnesium: 2.2 mg/dL (ref 1.6–2.3)

## 2024-01-12 ENCOUNTER — Other Ambulatory Visit: Payer: Self-pay | Admitting: Cardiovascular Disease

## 2024-01-19 DIAGNOSIS — H35371 Puckering of macula, right eye: Secondary | ICD-10-CM | POA: Diagnosis not present

## 2024-01-19 DIAGNOSIS — H5211 Myopia, right eye: Secondary | ICD-10-CM | POA: Diagnosis not present

## 2024-01-19 DIAGNOSIS — Z961 Presence of intraocular lens: Secondary | ICD-10-CM | POA: Diagnosis not present

## 2024-02-13 ENCOUNTER — Other Ambulatory Visit: Payer: Self-pay | Admitting: Cardiovascular Disease

## 2024-02-17 ENCOUNTER — Other Ambulatory Visit (HOSPITAL_COMMUNITY): Payer: Self-pay | Admitting: *Deleted

## 2024-02-17 MED ORDER — SOTALOL HCL 160 MG PO TABS: 80.0000 mg | ORAL_TABLET | Freq: Two times a day (BID) | ORAL | 2 refills | Status: AC
